# Patient Record
Sex: Female | Born: 1935 | Race: White | Hispanic: No | Marital: Single | Smoking: Current every day smoker
Health system: Southern US, Community
[De-identification: ages and names within clinical notes are randomized; demographics above are authoritative.]

## PROBLEM LIST (undated history)

## (undated) DIAGNOSIS — I10 Essential (primary) hypertension: Secondary | ICD-10-CM

## (undated) DIAGNOSIS — E7800 Pure hypercholesterolemia, unspecified: Secondary | ICD-10-CM

## (undated) DIAGNOSIS — J449 Chronic obstructive pulmonary disease, unspecified: Secondary | ICD-10-CM

## (undated) DIAGNOSIS — I4891 Unspecified atrial fibrillation: Secondary | ICD-10-CM

## (undated) DIAGNOSIS — I639 Cerebral infarction, unspecified: Secondary | ICD-10-CM

## (undated) DIAGNOSIS — I6522 Occlusion and stenosis of left carotid artery: Secondary | ICD-10-CM

## (undated) DIAGNOSIS — F329 Major depressive disorder, single episode, unspecified: Secondary | ICD-10-CM

## (undated) DIAGNOSIS — F419 Anxiety disorder, unspecified: Secondary | ICD-10-CM

## (undated) DIAGNOSIS — F32A Depression, unspecified: Secondary | ICD-10-CM

## (undated) DIAGNOSIS — R4701 Aphasia: Secondary | ICD-10-CM

---

## 2016-03-16 ENCOUNTER — Encounter (HOSPITAL_COMMUNITY): Payer: Self-pay | Admitting: Cardiology

## 2016-03-16 ENCOUNTER — Inpatient Hospital Stay (HOSPITAL_COMMUNITY)
Admission: EM | Admit: 2016-03-16 | Discharge: 2016-03-17 | DRG: 066 | Disposition: A | Discharge status: Home / Self Care | Payer: Medicare PPO | Attending: Internal Medicine | Admitting: Internal Medicine

## 2016-03-16 ENCOUNTER — Emergency Department (HOSPITAL_COMMUNITY): Payer: Medicare PPO

## 2016-03-16 ENCOUNTER — Inpatient Hospital Stay (HOSPITAL_COMMUNITY): Payer: Medicare PPO

## 2016-03-16 DIAGNOSIS — I63412 Cerebral infarction due to embolism of left middle cerebral artery: Secondary | ICD-10-CM | POA: Diagnosis present

## 2016-03-16 DIAGNOSIS — I69392 Facial weakness following cerebral infarction: Secondary | ICD-10-CM

## 2016-03-16 DIAGNOSIS — I10 Essential (primary) hypertension: Secondary | ICD-10-CM | POA: Diagnosis present

## 2016-03-16 DIAGNOSIS — E785 Hyperlipidemia, unspecified: Secondary | ICD-10-CM | POA: Diagnosis present

## 2016-03-16 DIAGNOSIS — I482 Chronic atrial fibrillation, unspecified: Secondary | ICD-10-CM | POA: Diagnosis present

## 2016-03-16 DIAGNOSIS — I6522 Occlusion and stenosis of left carotid artery: Secondary | ICD-10-CM | POA: Diagnosis present

## 2016-03-16 DIAGNOSIS — I639 Cerebral infarction, unspecified: Secondary | ICD-10-CM | POA: Diagnosis not present

## 2016-03-16 DIAGNOSIS — F17210 Nicotine dependence, cigarettes, uncomplicated: Secondary | ICD-10-CM | POA: Diagnosis present

## 2016-03-16 DIAGNOSIS — R8271 Bacteriuria: Secondary | ICD-10-CM | POA: Diagnosis present

## 2016-03-16 DIAGNOSIS — Z7289 Other problems related to lifestyle: Secondary | ICD-10-CM | POA: Diagnosis present

## 2016-03-16 DIAGNOSIS — J449 Chronic obstructive pulmonary disease, unspecified: Secondary | ICD-10-CM | POA: Diagnosis present

## 2016-03-16 DIAGNOSIS — Z7901 Long term (current) use of anticoagulants: Secondary | ICD-10-CM | POA: Diagnosis not present

## 2016-03-16 DIAGNOSIS — Z789 Other specified health status: Secondary | ICD-10-CM

## 2016-03-16 DIAGNOSIS — Z79890 Hormone replacement therapy (postmenopausal): Secondary | ICD-10-CM | POA: Diagnosis not present

## 2016-03-16 DIAGNOSIS — E7800 Pure hypercholesterolemia, unspecified: Secondary | ICD-10-CM | POA: Diagnosis present

## 2016-03-16 DIAGNOSIS — R4701 Aphasia: Secondary | ICD-10-CM

## 2016-03-16 DIAGNOSIS — F418 Other specified anxiety disorders: Secondary | ICD-10-CM | POA: Diagnosis present

## 2016-03-16 DIAGNOSIS — R4182 Altered mental status, unspecified: Secondary | ICD-10-CM | POA: Diagnosis not present

## 2016-03-16 DIAGNOSIS — I6789 Other cerebrovascular disease: Secondary | ICD-10-CM | POA: Diagnosis not present

## 2016-03-16 DIAGNOSIS — Z720 Tobacco use: Secondary | ICD-10-CM | POA: Diagnosis present

## 2016-03-16 HISTORY — DX: Aphasia: R47.01

## 2016-03-16 HISTORY — DX: Essential (primary) hypertension: I10

## 2016-03-16 HISTORY — DX: Pure hypercholesterolemia, unspecified: E78.00

## 2016-03-16 HISTORY — DX: Anxiety disorder, unspecified: F41.9

## 2016-03-16 HISTORY — DX: Chronic obstructive pulmonary disease, unspecified: J44.9

## 2016-03-16 HISTORY — DX: Occlusion and stenosis of left carotid artery: I65.22

## 2016-03-16 HISTORY — DX: Cerebral infarction, unspecified: I63.9

## 2016-03-16 HISTORY — DX: Major depressive disorder, single episode, unspecified: F32.9

## 2016-03-16 HISTORY — DX: Depression, unspecified: F32.A

## 2016-03-16 HISTORY — DX: Unspecified atrial fibrillation: I48.91

## 2016-03-16 LAB — CBC
HEMATOCRIT: 39.5 % (ref 36.0–46.0)
Hemoglobin: 13.9 g/dL (ref 12.0–15.0)
MCH: 32.6 pg (ref 26.0–34.0)
MCHC: 35.2 g/dL (ref 30.0–36.0)
MCV: 92.5 fL (ref 78.0–100.0)
PLATELETS: 163 10*3/uL (ref 150–400)
RBC: 4.27 MIL/uL (ref 3.87–5.11)
RDW: 13.2 % (ref 11.5–15.5)
WBC: 9.8 10*3/uL (ref 4.0–10.5)

## 2016-03-16 LAB — I-STAT CHEM 8, ED
BUN: 8 mg/dL (ref 6–20)
CHLORIDE: 98 mmol/L — AB (ref 101–111)
Calcium, Ion: 1.19 mmol/L (ref 1.12–1.23)
Creatinine, Ser: 0.6 mg/dL (ref 0.44–1.00)
GLUCOSE: 124 mg/dL — AB (ref 65–99)
HEMATOCRIT: 42 % (ref 36.0–46.0)
HEMOGLOBIN: 14.3 g/dL (ref 12.0–15.0)
POTASSIUM: 4.2 mmol/L (ref 3.5–5.1)
SODIUM: 135 mmol/L (ref 135–145)
TCO2: 26 mmol/L (ref 0–100)

## 2016-03-16 LAB — ETHANOL

## 2016-03-16 LAB — COMPREHENSIVE METABOLIC PANEL
ALT: 19 U/L (ref 14–54)
AST: 24 U/L (ref 15–41)
Albumin: 4.5 g/dL (ref 3.5–5.0)
Alkaline Phosphatase: 47 U/L (ref 38–126)
Anion gap: 9 (ref 5–15)
BUN: 10 mg/dL (ref 6–20)
CHLORIDE: 99 mmol/L — AB (ref 101–111)
CO2: 25 mmol/L (ref 22–32)
Calcium: 9.4 mg/dL (ref 8.9–10.3)
Creatinine, Ser: 0.66 mg/dL (ref 0.44–1.00)
Glucose, Bld: 129 mg/dL — ABNORMAL HIGH (ref 65–99)
POTASSIUM: 4.1 mmol/L (ref 3.5–5.1)
SODIUM: 133 mmol/L — AB (ref 135–145)
Total Bilirubin: 0.6 mg/dL (ref 0.3–1.2)
Total Protein: 7.9 g/dL (ref 6.5–8.1)

## 2016-03-16 LAB — RAPID URINE DRUG SCREEN, HOSP PERFORMED
AMPHETAMINES: NOT DETECTED
BENZODIAZEPINES: NOT DETECTED
Barbiturates: NOT DETECTED
COCAINE: NOT DETECTED
Opiates: NOT DETECTED
Tetrahydrocannabinol: NOT DETECTED

## 2016-03-16 LAB — I-STAT TROPONIN, ED: TROPONIN I, POC: 0 ng/mL (ref 0.00–0.08)

## 2016-03-16 LAB — DIFFERENTIAL
BASOS ABS: 0 10*3/uL (ref 0.0–0.1)
BASOS PCT: 0 %
EOS ABS: 0 10*3/uL (ref 0.0–0.7)
Eosinophils Relative: 0 %
Lymphocytes Relative: 11 %
Lymphs Abs: 1.1 10*3/uL (ref 0.7–4.0)
MONOS PCT: 5 %
Monocytes Absolute: 0.5 10*3/uL (ref 0.1–1.0)
NEUTROS ABS: 8.2 10*3/uL — AB (ref 1.7–7.7)
Neutrophils Relative %: 84 %

## 2016-03-16 LAB — URINE MICROSCOPIC-ADD ON

## 2016-03-16 LAB — URINALYSIS, ROUTINE W REFLEX MICROSCOPIC
Bilirubin Urine: NEGATIVE
Glucose, UA: NEGATIVE mg/dL
Ketones, ur: 15 mg/dL — AB
Nitrite: POSITIVE — AB
PROTEIN: NEGATIVE mg/dL
Specific Gravity, Urine: 1.02 (ref 1.005–1.030)
pH: 6 (ref 5.0–8.0)

## 2016-03-16 LAB — PROTIME-INR
INR: 2.23 — AB (ref 0.00–1.49)
PROTHROMBIN TIME: 24.5 s — AB (ref 11.6–15.2)

## 2016-03-16 LAB — TSH: TSH: 0.865 u[IU]/mL (ref 0.350–4.500)

## 2016-03-16 LAB — HEPARIN LEVEL (UNFRACTIONATED): HEPARIN UNFRACTIONATED: 1.97 [IU]/mL — AB (ref 0.30–0.70)

## 2016-03-16 LAB — APTT: APTT: 52 s — AB (ref 24–37)

## 2016-03-16 LAB — CBG MONITORING, ED: GLUCOSE-CAPILLARY: 131 mg/dL — AB (ref 65–99)

## 2016-03-16 MED ORDER — STROKE: EARLY STAGES OF RECOVERY BOOK
Freq: Once | Status: AC
  Administered 2016-03-17: 10:00:00
  Filled 2016-03-16: qty 1

## 2016-03-16 MED ORDER — NICOTINE 14 MG/24HR TD PT24
14.0000 mg | MEDICATED_PATCH | Freq: Every day | TRANSDERMAL | Status: DC
  Administered 2016-03-16 – 2016-03-17 (×2): 14 mg via TRANSDERMAL
  Filled 2016-03-16 (×2): qty 1

## 2016-03-16 MED ORDER — ONDANSETRON HCL 4 MG/2ML IJ SOLN: 4.0000 mg | Freq: Once | INTRAMUSCULAR | Status: AC

## 2016-03-16 MED ORDER — RIVAROXABAN 20 MG PO TABS: 20.0000 mg | ORAL_TABLET | Freq: Every day | ORAL | Status: DC

## 2016-03-16 MED ORDER — ONDANSETRON 4 MG PO TBDP: 8.0000 mg | ORAL_TABLET | Freq: Once | ORAL | Status: DC

## 2016-03-16 MED ORDER — HEPARIN (PORCINE) IN NACL 100-0.45 UNIT/ML-% IJ SOLN: 700.0000 [IU]/h | INTRAMUSCULAR | Status: DC

## 2016-03-16 MED ORDER — LEVALBUTEROL HCL 0.63 MG/3ML IN NEBU: 0.6300 mg | INHALATION_SOLUTION | Freq: Four times a day (QID) | RESPIRATORY_TRACT | Status: DC | PRN

## 2016-03-16 MED ORDER — ACETAMINOPHEN 650 MG RE SUPP: 650.0000 mg | RECTAL | Status: DC | PRN

## 2016-03-16 MED ORDER — RIVAROXABAN 15 MG PO TABS
15.0000 mg | ORAL_TABLET | Freq: Every day | ORAL | Status: DC
  Administered 2016-03-16 – 2016-03-17 (×2): 15 mg via ORAL
  Filled 2016-03-16 (×2): qty 1

## 2016-03-16 MED ORDER — HYDRALAZINE HCL 20 MG/ML IJ SOLN: 5.0000 mg | Freq: Four times a day (QID) | INTRAMUSCULAR | Status: DC | PRN

## 2016-03-16 MED ORDER — DEXTROSE-NACL 5-0.9 % IV SOLN
INTRAVENOUS | Status: DC
  Administered 2016-03-16 – 2016-03-17 (×2): via INTRAVENOUS

## 2016-03-16 MED ORDER — SODIUM CHLORIDE 0.9 % IV SOLN: INTRAVENOUS | Status: DC

## 2016-03-16 MED ORDER — ONDANSETRON HCL 4 MG/2ML IJ SOLN
INTRAMUSCULAR | Status: AC
  Filled 2016-03-16: qty 2

## 2016-03-16 MED ORDER — LORAZEPAM 2 MG/ML IJ SOLN: 0.2500 mg | Freq: Four times a day (QID) | INTRAMUSCULAR | Status: DC | PRN

## 2016-03-16 NOTE — ED Notes (Signed)
Pt back from MRI 

## 2016-03-16 NOTE — ED Notes (Signed)
Patients friend states he arrived at patients house this morning around 0730. States he found her in her pajamas and she would not speak. States he dressed her and brought her to the ED

## 2016-03-16 NOTE — Progress Notes (Signed)
2110 During neuro assessment patient was able to state her full name and birth date, reported that she was in the hospital because she had a stroke, no response when asked the year but correctly answered when asked who the president of the [place] is. Her responses are delayed but improvement noted. Significant other at the bedside during assessment and reported he noticed an improvement with her speaking and reported that she wasn't responding well to questions earlier today.

## 2016-03-16 NOTE — ED Notes (Signed)
Pt having dry heaves. Still speechless

## 2016-03-16 NOTE — ED Notes (Signed)
In to medicate patient for nausea. Family states they think patient might have been choking. Medication held. Pt experiencing no nausea or choking symptoms

## 2016-03-16 NOTE — ED Notes (Signed)
[facility]

## 2016-03-16 NOTE — Consult Note (Addendum)
[place] A. Merlene Laughter, MD     [email or web address]          Suzanne Pope is an 82 y.o. female.   ASSESSMENT/PLAN: 1. 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 The MRI features are limited to the cortical ribbon and does not involve one particular vascular territory. This raises concerns for the possibility of seizures especially given the patient presentation with confusion. 2. Chronic atrial fibrillation. 3. Hypertension. 4. Dyslipidemia.   RECOMMENDATION: Agree with current workup including echocardiogram and carotid Doppler. FU additional labs pending. EEG. Continue with Xarelto. I have discontinued the heparin as she is swallowing well. She passed her swallow evaluation. It is unclear if switching to another anticoagulation agent is of any particular value. Certainly added aspirin is not recommended given the increased risk of bleeding.    The patient is a 82 year old white female who lives by herself independently and is highly functional. She was found by her friend 7:30 this morning to be confused and having difficulty speaking. The patient is markedly a phasic on evaluation and cannot provide a history. She cannot state anything about why she is in the hospital or wound was a last time she was normal. The family and friend reported that the patient is highly functional. She is cognitively intact at baseline. She continues to have significant difficulties after presented to the hospital with the family. The review systems is severely limited given the cognitive impairment.    GENERAL: Pleasant thin female in no acute distress.  HEENT: Normal  ABDOMEN: soft  EXTREMITIES: No edema   BACK: Normal  SKIN: Normal by inspection.    MENTAL STATUS: She is awake and alert but is globally aphasic. She has a posterior speech. She does follow commands with much prompting. She knows that she is in the hospital. She States Her Age or the  Month.  CRANIAL NERVES: Pupils are equal, round and reactive to light and accomodation; extra ocular movements are full, there is no significant nystagmus; visual fields are full; upper and lower facial muscles are normal in strength and symmetric, there is no flattening of the nasolabial folds; tongue is midline; uvula is midline; shoulder elevation is normal.  MOTOR: She has deltoid weakness bilaterally. There is a mild drift involving the right upper extremity and right leg. Leg strength is 5/5. Bulk and tone are normal.   COORDINATION: Left finger to nose is normal, right finger to nose is normal, No rest tremor; no intention tremor; no postural tremor; no bradykinesia.  REFLEXES: Deep tendon reflexes are symmetrical and normal. Babinski reflexes are flexor bilaterally.   SENSATION: Normal to light touch and temperature. No clear visual or tactile extinction but limited given the cognitive impairment.   NIH stroke scale 2, 2, 2 total 6.    Blood pressure 168/58, pulse 59, temperature 98.7 F (37.1 C), temperature source Oral, resp. rate 18, height 5' 3"  (1.6 m), weight 135 lb (61.236 kg), SpO2 96 %.  Past Medical History  Diagnosis Date  . Atrial fibrillation ([place])   . COPD (chronic obstructive pulmonary disease) ([place])   . Hypertension   . Anxiety   . Depression   . Hypercholesteremia     History reviewed. No pertinent past surgical history.  History reviewed. No pertinent family history.  Social History:  reports that she has been smoking.  She does not have any smokeless tobacco history on file. She reports that she drinks alcohol. She reports that she does not use illicit drugs.  Allergies:  Allergies  Allergen Reactions  . Augmentin [Amoxicillin-Pot Clavulanate] Anaphylaxis  . Codeine Nausea Only  . Erythromycin     Medications: Prior to Admission medications   Medication Sig Start Date End Date Taking? Authorizing Provider  ALPRAZolam (XANAX) 0.25 MG tablet  Take 0.25 mg by mouth daily.  01/26/16  Yes Historical Provider, MD  estradiol (CLIMARA - DOSED IN MG/24 HR) 0.025 mg/24hr patch Place 0.025 mg onto the skin once a week.  01/25/16  Yes Historical Provider, MD  losartan (COZAAR) 50 MG tablet Take 50 mg by mouth daily.  01/12/16  Yes Historical Provider, MD  metoprolol tartrate (LOPRESSOR) 25 MG tablet Take 25 mg by mouth 2 (two) times daily.  12/21/15  Yes Historical Provider, MD  rivaroxaban (XARELTO) 20 MG TABS tablet Take 20 mg by mouth daily with supper.   Yes Historical Provider, MD  sertraline (ZOLOFT) 25 MG tablet Take 25 mg by mouth daily.   Yes Historical Provider, MD    Scheduled Meds: .  stroke: mapping our early stages of recovery book   Does not apply Once  . nicotine  14 mg Transdermal Daily   Continuous Infusions: . dextrose 5 % and 0.9% NaCl    . heparin     PRN Meds:.acetaminophen, hydrALAZINE, levalbuterol, LORazepam     Results for orders placed or performed during the hospital encounter of 03/16/16 (from the past 48 hour(s))  Ethanol     Status: None   Collection Time: 03/16/16  9:03 AM  Result Value Ref Range   Alcohol, Ethyl (B) <5 <5 mg/dL    Comment:        LOWEST DETECTABLE LIMIT FOR SERUM ALCOHOL IS 5 mg/dL FOR MEDICAL PURPOSES ONLY   Protime-INR     Status: Abnormal   Collection Time: 03/16/16  9:03 AM  Result Value Ref Range   Prothrombin Time 24.5 (H) 11.6 - 15.2 seconds   INR 2.23 (H) 0.00 - 1.49  [name]     Status: Abnormal   Collection Time: 03/16/16  9:03 AM  Result Value Ref Range   [name] 52 (H) 24 - 37 seconds    Comment:        IF BASELINE [name] IS ELEVATED, SUGGEST PATIENT RISK ASSESSMENT BE USED TO DETERMINE APPROPRIATE ANTICOAGULANT THERAPY.   CBC     Status: None   Collection Time: 03/16/16  9:03 AM  Result Value Ref Range   WBC 9.8 4.0 - 10.5 K/uL   RBC 4.27 3.87 - 5.11 MIL/uL   Hemoglobin 13.9 12.0 - 15.0 g/dL   HCT 39.5 36.0 - 46.0 %   MCV 92.5 78.0 - 100.0 fL   MCH 32.6 26.0 -  34.0 pg   MCHC 35.2 30.0 - 36.0 g/dL   RDW 13.2 11.5 - 15.5 %   Platelets 163 150 - 400 K/uL  Differential     Status: Abnormal   Collection Time: 03/16/16  9:03 AM  Result Value Ref Range   Neutrophils Relative % 84 %   Neutro Abs 8.2 (H) 1.7 - 7.7 K/uL   Lymphocytes Relative 11 %   Lymphs Abs 1.1 0.7 - 4.0 K/uL   Monocytes Relative 5 %   Monocytes Absolute 0.5 0.1 - 1.0 K/uL   Eosinophils Relative 0 %   Eosinophils Absolute 0.0 0.0 - 0.7 K/uL   Basophils Relative 0 %   Basophils Absolute 0.0 0.0 - 0.1 K/uL  Comprehensive metabolic panel     Status: Abnormal   Collection Time:  03/16/16  9:03 AM  Result Value Ref Range   Sodium 133 (L) 135 - 145 mmol/L   Potassium 4.1 3.5 - 5.1 mmol/L   Chloride 99 (L) 101 - 111 mmol/L   CO2 25 22 - 32 mmol/L   Glucose, Bld 129 (H) 65 - 99 mg/dL   BUN 10 6 - 20 mg/dL   Creatinine, Ser 0.66 0.44 - 1.00 mg/dL   Calcium 9.4 8.9 - 10.3 mg/dL   Total Protein 7.9 6.5 - 8.1 g/dL   Albumin 4.5 3.5 - 5.0 g/dL   AST 24 15 - 41 U/L   ALT 19 14 - 54 U/L   Alkaline Phosphatase 47 38 - 126 U/L   Total Bilirubin 0.6 0.3 - 1.2 mg/dL   GFR calc non Af Amer >60 >60 mL/min   GFR calc Af Amer >60 >60 mL/min    Comment: (NOTE) The eGFR has been calculated using the CKD EPI equation. This calculation has not been validated in all clinical situations. eGFR's persistently <60 mL/min signify possible Chronic Kidney Disease.    Anion gap 9 5 - 15  CBG monitoring, ED     Status: Abnormal   Collection Time: 03/16/16  9:05 AM  Result Value Ref Range   Glucose-Capillary 131 (H) 65 - 99 mg/dL  I-stat troponin, ED (not at [facility], [facility])     Status: None   Collection Time: 03/16/16  9:16 AM  Result Value Ref Range   Troponin i, poc 0.00 0.00 - 0.08 ng/mL   Comment 3            Comment: Due to the release kinetics of cTnI, a negative result within the first hours of the onset of symptoms does not rule out myocardial infarction with certainty. If myocardial  infarction is still suspected, repeat the test at appropriate intervals.   I-Stat Chem 8, ED  (not at [facility], [facility])     Status: Abnormal   Collection Time: 03/16/16  9:18 AM  Result Value Ref Range   Sodium 135 135 - 145 mmol/L   Potassium 4.2 3.5 - 5.1 mmol/L   Chloride 98 (L) 101 - 111 mmol/L   BUN 8 6 - 20 mg/dL   Creatinine, Ser 0.60 0.44 - 1.00 mg/dL   Glucose, Bld 124 (H) 65 - 99 mg/dL   Calcium, Ion 1.19 1.12 - 1.23 mmol/L   TCO2 26 0 - 100 mmol/L   Hemoglobin 14.3 12.0 - 15.0 g/dL   HCT 42.0 36.0 - 46.0 %  Urine rapid drug screen (hosp performed)not at [facility]     Status: None   Collection Time: 03/16/16  1:24 PM  Result Value Ref Range   Opiates NONE DETECTED NONE DETECTED   Cocaine NONE DETECTED NONE DETECTED   Benzodiazepines NONE DETECTED NONE DETECTED   Amphetamines NONE DETECTED NONE DETECTED   Tetrahydrocannabinol NONE DETECTED NONE DETECTED   Barbiturates NONE DETECTED NONE DETECTED    Comment:        DRUG SCREEN FOR MEDICAL PURPOSES ONLY.  IF CONFIRMATION IS NEEDED FOR ANY PURPOSE, NOTIFY LAB WITHIN 5 DAYS.        LOWEST DETECTABLE LIMITS FOR URINE DRUG SCREEN Drug Class       Cutoff (ng/mL) Amphetamine      1000 Barbiturate      200 Benzodiazepine   518 Tricyclics       841 Opiates          300 Cocaine  300 THC              50   Urinalysis, Routine w reflex microscopic (not at [facility])     Status: Abnormal   Collection Time: 03/16/16  1:24 PM  Result Value Ref Range   Color, Urine YELLOW YELLOW   APPearance CLEAR CLEAR   Specific Gravity, Urine 1.020 1.005 - 1.030   pH 6.0 5.0 - 8.0   Glucose, UA NEGATIVE NEGATIVE mg/dL   Hgb urine dipstick MODERATE (A) NEGATIVE   Bilirubin Urine NEGATIVE NEGATIVE   Ketones, ur 15 (A) NEGATIVE mg/dL   Protein, ur NEGATIVE NEGATIVE mg/dL   Nitrite POSITIVE (A) NEGATIVE   Leukocytes, UA SMALL (A) NEGATIVE  Urine microscopic-add on     Status: Abnormal   Collection Time: 03/16/16  1:24 PM  Result Value Ref Range    Squamous Epithelial / LPF 6-30 (A) NONE SEEN   WBC, UA 6-30 0 - 5 WBC/hpf   RBC / HPF 6-30 0 - 5 RBC/hpf   Bacteria, UA MANY (A) NONE SEEN    Studies/Results:    BRAIN MRI FINDINGS: There are small acute infarcts in the left cerebral hemisphere involving both the ACA and MCA territories. The largest infarcts are cortically based in the medial left frontal lobe and cingulate gyrus and measure up to 2.5 cm in size. Additional subcentimeter infarcts are noted slightly more laterally in the posterior left frontal lobe as well as in the region of the left caudate body and posterior left temporal lobe.  There is no evidence of acute intracranial hemorrhage, mass, midline shift, or extra-axial fluid collection. There may be a chronic microhemorrhage in the left temporal lobe. Patchy to confluent T2 hyperintensities in the deep cerebral white matter bilaterally are nonspecific but compatible with moderate chronic small vessel ischemic disease. Mild chronic ischemic changes are noted in the pons and thalami. There are tiny chronic infarcts in the cerebellum bilaterally as well as basal ganglia. There is mild generalized cerebral atrophy.  Prior bilateral cataract extraction is noted. There are trace bilateral mastoid effusions. The paranasal sinuses are clear. Major intracranial vascular flow voids are preserved.  IMPRESSION: 1. Small acute left ACA and left MCA infarcts with greatest involvement of the frontal lobe and cingulate gyrus. 2. Moderate chronic small vessel ischemic disease.       The patient's brain MRI is reviewed in person. There is increased signal seen on diffusion imaging involving the left frontal regions respecting no particular vascular territory. There are a few of these limited to the cortical ribbon including the medial frontal region and the lateral cortical or area.  There is corresponding reduced signal seen on the ADC scan. There is no  microhemorrhages. There is moderate global atrophy. There is moderate deep white matter and periventricular leukoencephalopathy.      Orissa Arreaga A. Merlene Laughter, M.D.  Diplomate, Tax adviser of Psychiatry and Neurology ( Neurology). 03/16/2016, 5:54 PM

## 2016-03-16 NOTE — Progress Notes (Signed)
ANTICOAGULATION CONSULT NOTE - Initial Consult  Pharmacy Consult for Heparin Indication: atrial fibrillation / embolic stroke  Allergies  Allergen Reactions  . Augmentin [Amoxicillin-Pot Clavulanate] Anaphylaxis  . Codeine Nausea Only  . Erythromycin     Patient Measurements: Height: 5\' 3"  (160 cm) Weight: 135 lb (61.236 kg) IBW/kg (Calculated) : 52.4 Heparin Dosing Weight: 61.2 kg  Vital Signs: Temp: 98.5 F (36.9 C) (07/20 0910) Temp Source: Oral (07/20 0910) BP: 176/63 mmHg (07/20 1530) Pulse Rate: 56 (07/20 1530)  Labs:  Recent Labs  03/16/16 0903 03/16/16 0918  HGB 13.9 14.3  HCT 39.5 42.0  PLT 163  --   [name] 52*  --   LABPROT 24.5*  --   INR 2.23*  --   CREATININE 0.66 0.60    Estimated Creatinine Clearance: 47.2 mL/min (by C-G formula based on Cr of 0.6).   Medical History: Past Medical History  Diagnosis Date  . Atrial fibrillation ([facility])   . COPD (chronic obstructive pulmonary disease) ([facility])   . Hypertension   . Anxiety   . Depression   . Hypercholesteremia     Medications:  Prescriptions prior to admission  Medication Sig Dispense Refill Last Dose  . ALPRAZolam (XANAX) 0.25 MG tablet Take 0.25 mg by mouth daily.    unknown  . estradiol (CLIMARA - DOSED IN MG/24 HR) 0.025 mg/24hr patch Place 0.025 mg onto the skin once a week.    unknown  . losartan (COZAAR) 50 MG tablet Take 50 mg by mouth daily.    unknown  . metoprolol tartrate (LOPRESSOR) 25 MG tablet Take 25 mg by mouth 2 (two) times daily.    unknown  . rivaroxaban (XARELTO) 20 MG TABS tablet Take 20 mg by mouth daily with supper.   unknown  . sertraline (ZOLOFT) 25 MG tablet Take 25 mg by mouth daily.   unknown    Assessment: 82 yo female admitted from ED with acute CVA. Patient taking Xarelto PTA for atrial fibrillation, last dose apparently yesterday at supper.  Pharmacy protocol for heparin for AFIB. Baseline [name] elevated most likely due to patient on Xarelto. Baseline heparin  level ordered. Pharmacy will use [name] level to adjust heparin if baseline heparin level elevated due to patient being on Xarelto    Goal of Therapy:  Heparin level 0.3-0.5 units/ml [name] 66-85 seconds Monitor platelets by anticoagulation protocol: Yes   Plan:  Start heparin infusion at 700 units/hr (WITH NO BOLUS) after baseline heparin level drawn, do not have to wait for results Check heparin level in 8 hours and daily while on heparin [name] level in 8 hours and daily until heparin level correlates with [name] level Continue to monitor H&H and platelets  Raquel JamesPittman, Burle Kwan Bennett 03/16/2016,5:03 PM

## 2016-03-16 NOTE — ED Notes (Signed)
Pt is talking now. She identifies the female in her room as her friend but she identifies the female as her nephew

## 2016-03-16 NOTE — ED Notes (Signed)
cbg of 131.

## 2016-03-16 NOTE — ED Provider Notes (Signed)
CSN: [id number]     Arrival date & time 03/16/16  4098 History  By signing my name below, I, Suzanne Pope, attest that this documentation has been prepared under the direction and in the presence of Suzanne Bale, MD. Electronically Signed: Tanda Pope, ED Scribe. 03/16/2016. 9:26 AM.   Chief Complaint  Patient presents with  . Stroke Symptoms   LEVEL 5 CAVEAT for altered mental status  The history is provided by medical records and the EMS personnel. No language interpreter was used.   HPI Comments: Suzanne Pope is a 82 y.o. female who presents to the Emergency Department for possible stroke. Per nurse, pt's friend came to check on her at home at 7:30 AM and noticed pt was unable to speak. Friend took pt to an urgent care at [facility] but was told to come to the ED instead. Nurse states that pt was able to ambulate out of the wheelchair into the ED but was very slow with walking. Last normal sometime last night.   Spoke with family and friend, friend states at 7:30 pt was standing in middle of floor standing and staring straight off into space. He has never seen pt act like this in the past. Pt is on anticoagulants. Friend saw pt last night and states she was acting at baseline. Niece reports that pt has been looking at pt and understands them but cannot speak.   Past Medical History  Diagnosis Date  . Atrial fibrillation (HCC)   . COPD (chronic obstructive pulmonary disease) (HCC)   . Hypertension   . Anxiety   . Depression   . Hypercholesteremia    History reviewed. No pertinent past surgical history. History reviewed. No pertinent family history. Social History  Substance Use Topics  . Smoking status: Current Every Day Smoker  . Smokeless tobacco: None  . Alcohol Use: Yes     Comment: occ   OB History    No data available     Review of Systems  Unable to perform ROS: Patient nonverbal   Allergies  Augmentin; Codeine; and Erythromycin  Home Medications   Prior to  Admission medications   Medication Sig Start Date End Date Taking? Authorizing Provider  ALPRAZolam (XANAX) 0.25 MG tablet Take 0.25 mg by mouth daily.  01/26/16  Yes Historical Provider, MD  estradiol (CLIMARA - DOSED IN MG/24 HR) 0.025 mg/24hr patch Place 0.025 mg onto the skin once a week.  01/25/16  Yes Historical Provider, MD  losartan (COZAAR) 50 MG tablet Take 50 mg by mouth daily.  01/12/16  Yes Historical Provider, MD  metoprolol tartrate (LOPRESSOR) 25 MG tablet Take 25 mg by mouth 2 (two) times daily.  12/21/15  Yes Historical Provider, MD  rivaroxaban (XARELTO) 20 MG TABS tablet Take 20 mg by mouth daily with supper.   Yes Historical Provider, MD  sertraline (ZOLOFT) 25 MG tablet Take 25 mg by mouth daily.   Yes Historical Provider, MD   BP 173/57 mmHg  Pulse 56  Temp(Src) 98.5 F (36.9 C) (Oral)  Resp 18  Ht 5\' 3"  (1.6 m)  Wt 135 lb (61.236 kg)  BMI 23.92 kg/m2  SpO2 97%   Physical Exam  Constitutional: She appears well-developed and well-nourished.  HENT:  Head: Normocephalic and atraumatic.  Eyes: Conjunctivae and EOM are normal. Pupils are equal, round, and reactive to light.  Neck: Normal range of motion and phonation normal. Neck supple.  Cardiovascular: Normal rate and regular rhythm.   Pulmonary/Chest: Effort normal and breath sounds  normal. She exhibits no tenderness.  Abdominal: Soft. She exhibits no distension. There is no tenderness. There is no guarding.  Musculoskeletal: Normal range of motion.  Neurological: She is alert. She exhibits normal muscle tone.  No receptive aphasia Follows commands accurately Nonverbal   Skin: Skin is warm and dry.  Nursing note and vitals reviewed.   ED Course  Procedures (including critical care time)  DIAGNOSTIC STUDIES: Oxygen Saturation is 97% on RA, normal by my interpretation.    Medications  ondansetron (ZOFRAN) injection 4 mg (not administered)  ondansetron (ZOFRAN-ODT) disintegrating tablet 8 mg (8 mg Oral Not  Given 03/16/16 1128)    Patient Vitals for the past 24 hrs:  BP Temp Temp src Pulse Resp SpO2 Height Weight  03/16/16 1430 173/57 mmHg - - (!) 56 18 97 % - -  03/16/16 1400 168/59 mmHg - - (!) 58 21 96 % - -  03/16/16 1330 144/83 mmHg - - (!) 58 21 96 % - -  03/16/16 1324 174/56 mmHg - - 60 20 95 % - -  03/16/16 1300 174/56 mmHg - - 61 20 95 % - -  03/16/16 1230 138/57 mmHg - - (!) 53 18 96 % - -  03/16/16 1200 165/98 mmHg - - (!) 57 21 96 % - -  03/16/16 1145 - - - (!) 57 21 98 % - -  03/16/16 1143 - - - (!) 57 19 98 % - -  03/16/16 1139 160/89 mmHg - - - - - - -  03/16/16 1100 - - - (!) 57 22 96 % - -  03/16/16 1045 - - - (!) 58 20 97 % - -  03/16/16 0910 182/65 mmHg 98.5 F (36.9 C) Oral (!) 59 16 97 % 5\' 3"  (1.6 m) 135 lb (61.236 kg)    COORDINATION OF CARE: 9:12 AM-[name], Chem 8, EtOH, [name], [name], CBC, Differential, CMP, Troponin, Rapid drug screen, UA, and CBG   10:11 AM- Spoke with family and friend. Friend states at 7:30 AM pt was standing in middle of floor standing and staring straight off into space. He has never seen pt act like this in the past. Friend saw pt last night and states she was acting at baseline. Niece reports that pt has been looking at pt in ED and understands them but cannot speak. Pt did smile to family member while in ED. Pt is on anticoagulants but friend/neice are unaware of the type of blood thinner.   11:00 AM Reevaluation with update and discussion. After initial assessment and treatment, an updated evaluation reveals Patient is sitting up, appearing more alert, and has very trace right facial weakness. When asked for response, she verbalized an unintelligible sound. Patient and family updated on findings. MRI ordered to evaluate for CT occult stroke. Suzanne Pope L   2:42 PM- Case discussed with tele-neurology, who advises hospitalization, further evaluation with carotid Dopplers, cardiac echo, speech and physical  therapy. They will be sending additional information, by fax.  2:43 PM-Consult complete with hospitalist. Patient case explained and discussed. She agrees to admit patient for further evaluation and treatment. Call ended at 1450  CRITICAL CARE Performed by: Flint MelterWENTZ,Ulus Hazen L Total critical care time: 35 minutes Critical care time was exclusive of separately billable procedures and treating other patients. Critical care was necessary to treat or prevent imminent or life-threatening deterioration. Critical care was time spent personally by me on the following activities: development of treatment plan with patient and/or  surrogate as well as nursing, discussions with consultants, evaluation of patient's response to treatment, examination of patient, obtaining history from patient or surrogate, ordering and performing treatments and interventions, ordering and review of laboratory studies, ordering and review of radiographic studies, pulse oximetry and re-evaluation of patient's condition.   Labs Review Labs Reviewed  PROTIME-INR - Abnormal; Notable for the following:    Prothrombin Time 24.5 (*)    INR 2.23 (*)    All other components within normal limits  [name] - Abnormal; Notable for the following:    [name] 52 (*)    All other components within normal limits  DIFFERENTIAL - Abnormal; Notable for the following:    Neutro Abs 8.2 (*)    All other components within normal limits  COMPREHENSIVE METABOLIC PANEL - Abnormal; Notable for the following:    Sodium 133 (*)    Chloride 99 (*)    Glucose, Bld 129 (*)    All other components within normal limits  URINALYSIS, ROUTINE W REFLEX MICROSCOPIC (NOT AT [facility]) - Abnormal; Notable for the following:    Hgb urine dipstick MODERATE (*)    Ketones, ur 15 (*)    Nitrite POSITIVE (*)    Leukocytes, UA SMALL (*)    All other components within normal limits  URINE MICROSCOPIC-ADD ON - Abnormal; Notable for the following:    Squamous Epithelial / LPF  6-30 (*)    Bacteria, UA MANY (*)    All other components within normal limits  CBG MONITORING, ED - Abnormal; Notable for the following:    Glucose-Capillary 131 (*)    All other components within normal limits  I-STAT CHEM 8, ED - Abnormal; Notable for the following:    Chloride 98 (*)    Glucose, Bld 124 (*)    All other components within normal limits  ETHANOL  CBC  URINE RAPID DRUG SCREEN, HOSP PERFORMED  I-STAT TROPOININ, ED    Imaging Review Ct Head Wo Contrast  03/16/2016  CLINICAL DATA:  Altered mental status.  Stroke symptoms. EXAM: CT HEAD WITHOUT CONTRAST TECHNIQUE: Contiguous axial images were obtained from the base of the skull through the vertex without intravenous contrast. COMPARISON:  None. FINDINGS: Brain: Scattered minimal periventricular hypodensities compatible microvascular ischemic disease. The gray-white differentiation is otherwise well maintained without CT evidence of acute large territory infarct. No intraparenchymal or extra-axial mass or hemorrhage. Normal size and configuration of the ventricles and basilar cisterns. No midline shift. Vascular: No hyperdense vessel or unexpected calcification. Skull: No displaced calvarial fracture. Sinuses/Orbits: Limited visualization the paranasal sinuses and mastoid air cells is normal. No air-fluid levels. Post bilateral cataract surgery. Other: Regional soft tissues appear normal. IMPRESSION: Microvascular ischemic disease without acute intracranial process. Electronically Signed   By: Simonne Come M.D.   On: 03/16/2016 10:25   Mr Brain Wo Contrast  03/16/2016  CLINICAL DATA:  Aphasia.  Altered mental status. EXAM: MRI HEAD WITHOUT CONTRAST TECHNIQUE: Multiplanar, multiecho pulse sequences of the brain and surrounding structures were obtained without intravenous contrast. COMPARISON:  Head CT 03/16/2016 FINDINGS: There are small acute infarcts in the left cerebral hemisphere involving both the ACA and MCA territories. The  largest infarcts are cortically based in the medial left frontal lobe and cingulate gyrus and measure up to 2.5 cm in size. Additional subcentimeter infarcts are noted slightly more laterally in the posterior left frontal lobe as well as in the region of the left caudate body and posterior left temporal lobe. There is no evidence  of acute intracranial hemorrhage, mass, midline shift, or extra-axial fluid collection. There may be a chronic microhemorrhage in the left temporal lobe. Patchy to confluent T2 hyperintensities in the deep cerebral white matter bilaterally are nonspecific but compatible with moderate chronic small vessel ischemic disease. Mild chronic ischemic changes are noted in the pons and thalami. There are tiny chronic infarcts in the cerebellum bilaterally as well as basal ganglia. There is mild generalized cerebral atrophy. Prior bilateral cataract extraction is noted. There are trace bilateral mastoid effusions. The paranasal sinuses are clear. Major intracranial vascular flow voids are preserved. IMPRESSION: 1. Small acute left ACA and left MCA infarcts with greatest involvement of the frontal lobe and cingulate gyrus. 2. Moderate chronic small vessel ischemic disease. Electronically Signed   By: Sebastian Ache M.D.   On: 03/16/2016 11:54   I have personally reviewed and evaluated these images and lab results as part of my medical decision-making.   EKG Interpretation   Date/Time:  Thursday March 16 2016 09:08:27 EDT Ventricular Rate:  60 PR Interval:    QRS Duration: 100 QT Interval:  442 QTC Calculation: 442 R Axis:   79 Text Interpretation:  Sinus rhythm Borderline ST depression, inferior  leads No old tracing to compare Confirmed by [facility]  MD, Lekeisha Arenas [phone]) on  03/16/2016 9:13:00 AM      MDM   Final diagnoses:  Cerebral infarction due to unspecified mechanism    Acute CVA, not a candidate for thrombolysis. She will require hospitalization for further testing and  treatment.  Nursing Notes Reviewed/ Care Coordinated, and agree without changes. Applicable Imaging Reviewed.  Interpretation of Laboratory Data incorporated into ED treatment  Plan: Admit  I personally performed the services described in this documentation, which was scribed in my presence. The recorded information has been reviewed and is accurate.      Suzanne Bale, MD 03/16/16 1538

## 2016-03-16 NOTE — H&P (Addendum)
History and Physical    Suzanne Pope ZOX:[id number] DOB: [birth year] DOA: 03/16/2016  PCP: [facility], query Dr. Shela Commons  Patient coming from: Home  Chief Complaint: Altered mental status; patient unable to speak.  HPI: Suzanne Pope is a 82 y.o. female with medical history significant for chronic atrial fibrillation on Xarelto, hypertension, COPD, anxiety with depression, and tobacco abuse who presents to the ED after her female friend, Mr. Suzanne Pope, found her this morning at approximately 7:30 AM confused and unable to speak. (Patient has expressive aphasia, so her niece, Ms. Lorre Munroe is providing the history along with the ED staff). Apparently, Mr. Suzanne Pope found her this morning standing in the middle of the floor staring in space and unable to speak. He took her to the urgent care in [place], but was told to come to the ED instead. The patient was able to ambulate with assistance. There was no reported incontinence of bladder bowels, seizure activity, or any type of trauma. Reportedly, the patient was in her usual state of health last night when Mr. Suzanne Pope left her at 9:00 PM. It is unclear when her symptoms actually started.  ED Course: In the ED, she was moderately hypertensive and mildly bradycardic. She was afebrile.  MRI of her brain revealed small acute left ACA and left MCA infarcts with greatest involvement of the frontal lobe and moderate chronic small vessel ischemic disease. Her INR was 2.23. Urinalysis revealed many bacteria, nitrite, and small leukocytes. Urine drug screen was negative. She was admitted for further evaluation and management.  Review of Systems: Unable to obtain as the patient has expressive aphasia.  Past Medical History  Diagnosis Date  . Atrial fibrillation (HCC)   . COPD (chronic obstructive pulmonary disease) (HCC)   . Hypertension   . Anxiety   . Depression   . Hypercholesteremia     History reviewed. No pertinent past surgical history.  Social history: She is  widowed, but has a close female friend. She lives alone. She has no children. She smokes greater than one pack of cigarettes per day. She drinks alcohol daily, but her niece was unable to quantify. She has no history of illicit drug use.  Allergies  Allergen Reactions  . Augmentin [Amoxicillin-Pot Clavulanate] Anaphylaxis  . Codeine Nausea Only  . Erythromycin    Family history: (Provided by the patient's niece) patient's parents are deceased; etiology of her father's death unknown, but her mother died of a brain tumor.   Prior to Admission medications   Medication Sig Start Date End Date Taking? Authorizing Provider  ALPRAZolam (XANAX) 0.25 MG tablet Take 0.25 mg by mouth daily.  01/26/16  Yes Historical Provider, MD  estradiol (CLIMARA - DOSED IN MG/24 HR) 0.025 mg/24hr patch Place 0.025 mg onto the skin once a week.  01/25/16  Yes Historical Provider, MD  losartan (COZAAR) 50 MG tablet Take 50 mg by mouth daily.  01/12/16  Yes Historical Provider, MD  metoprolol tartrate (LOPRESSOR) 25 MG tablet Take 25 mg by mouth 2 (two) times daily.  12/21/15  Yes Historical Provider, MD  rivaroxaban (XARELTO) 20 MG TABS tablet Take 20 mg by mouth daily with supper.   Yes Historical Provider, MD  sertraline (ZOLOFT) 25 MG tablet Take 25 mg by mouth daily.   Yes Historical Provider, MD    Physical Exam: Filed Vitals:   03/16/16 1400 03/16/16 1430 03/16/16 1500 03/16/16 1530  BP: 168/59 173/57 181/70 176/63  Pulse: 58 56 59 56  Temp:  TempSrc:      Resp: 21 18 19 20   Height:      Weight:      SpO2: 96% 97% 96% 97%      Constitutional: NAD, calm, comfortable Filed Vitals:   03/16/16 1400 03/16/16 1430 03/16/16 1500 03/16/16 1530  BP: 168/59 173/57 181/70 176/63  Pulse: 58 56 59 56  Temp:      TempSrc:      Resp: 21 18 19 20   Height:      Weight:      SpO2: 96% 97% 96% 97%   Eyes: PERRL, conjunctivae normal; mild right-sided ptosis ENMT: Mucous membranes are mildly dry. Posterior  pharynx clear of any exudate or lesions.Normal dentition.  Neck: normal, supple, no masses, no thyromegaly; no obvious audible bruit Respiratory: clear to auscultation bilaterally, no wheezing, no crackles. Normal respiratory effort. No accessory muscle use.  Cardiovascular: Irregular, irregular. No extremity edema. 2+ pedal pulses. No carotid bruits.  Abdomen: no tenderness, no masses palpated. No hepatosplenomegaly. Bowel sounds positive.  Musculoskeletal: no clubbing / cyanosis. No joint deformity upper and lower extremities. Good ROM, no contractures. Normal muscle tone.  Skin: no rashes, lesions, ulcers. No induration Neurologic: Right facial droop. Patient had difficulty protruding her tongue, but was slowly able to move her tongue out into the left into the right; mild decrease elevation of palate on the right; advanced expressive aphasia, but the patient was intermittently able to correctly verbalize how many fingers the examiner was holding up. Sensation intact. Mild decreased strength of hand grip on the right at 4+ over 5 and 5 over 5 strength of the left upper extremity. Patient was able to raise both legs against gravity approximately 45. No significant pronator drift.  Psychiatric: She appears to have a pleasant affect and smiled a couple of times.    Labs on Admission: I have personally reviewed following labs and imaging studies  CBC:  Recent Labs Lab 03/16/16 0903 03/16/16 0918  WBC 9.8  --   NEUTROABS 8.2*  --   HGB 13.9 14.3  HCT 39.5 42.0  MCV 92.5  --   PLT 163  --    Basic Metabolic Panel:  Recent Labs Lab 03/16/16 0903 03/16/16 0918  NA 133* 135  K 4.1 4.2  CL 99* 98*  CO2 25  --   GLUCOSE 129* 124*  BUN 10 8  CREATININE 0.66 0.60  CALCIUM 9.4  --    GFR: Estimated Creatinine Clearance: 47.2 mL/min (by C-G formula based on Cr of 0.6). Liver Function Tests:  Recent Labs Lab 03/16/16 0903  AST 24  ALT 19  ALKPHOS 47  BILITOT 0.6  PROT 7.9    ALBUMIN 4.5   No results for input(s): LIPASE, AMYLASE in the last 168 hours. No results for input(s): AMMONIA in the last 168 hours. Coagulation Profile:  Recent Labs Lab 03/16/16 0903  INR 2.23*   Cardiac Enzymes: No results for input(s): CKTOTAL, CKMB, CKMBINDEX, TROPONINI in the last 168 hours. BNP (last 3 results) No results for input(s): PROBNP in the last 8760 hours. HbA1C: No results for input(s): HGBA1C in the last 72 hours. CBG:  Recent Labs Lab 03/16/16 0905  GLUCAP 131*   Lipid Profile: No results for input(s): CHOL, HDL, LDLCALC, TRIG, CHOLHDL, LDLDIRECT in the last 72 hours. Thyroid Function Tests: No results for input(s): TSH, T4TOTAL, FREET4, T3FREE, THYROIDAB in the last 72 hours. Anemia Panel: No results for input(s): VITAMINB12, FOLATE, FERRITIN, TIBC, IRON, RETICCTPCT in the last  72 hours. Urine analysis:    Component Value Date/Time   COLORURINE YELLOW 03/16/2016 1324   APPEARANCEUR CLEAR 03/16/2016 1324   LABSPEC 1.020 03/16/2016 1324   PHURINE 6.0 03/16/2016 1324   GLUCOSEU NEGATIVE 03/16/2016 1324   HGBUR MODERATE* 03/16/2016 1324   BILIRUBINUR NEGATIVE 03/16/2016 1324   KETONESUR 15* 03/16/2016 1324   PROTEINUR NEGATIVE 03/16/2016 1324   NITRITE POSITIVE* 03/16/2016 1324   LEUKOCYTESUR SMALL* 03/16/2016 1324   Sepsis Labs: !!!!!!!!!!!!!!!!!!!!!!!!!!!!!!!!!!!!!!!!!!!! @LABRCNTIP (procalcitonin:4,lacticidven:4) )No results found for this or any previous visit (from the past 240 hour(s)).   Radiological Exams on Admission: Ct Head Wo Contrast  03/16/2016  CLINICAL DATA:  Altered mental status.  Stroke symptoms. EXAM: CT HEAD WITHOUT CONTRAST TECHNIQUE: Contiguous axial images were obtained from the base of the skull through the vertex without intravenous contrast. COMPARISON:  None. FINDINGS: Brain: Scattered minimal periventricular hypodensities compatible microvascular ischemic disease. The gray-white differentiation is otherwise well  maintained without CT evidence of acute large territory infarct. No intraparenchymal or extra-axial mass or hemorrhage. Normal size and configuration of the ventricles and basilar cisterns. No midline shift. Vascular: No hyperdense vessel or unexpected calcification. Skull: No displaced calvarial fracture. Sinuses/Orbits: Limited visualization the paranasal sinuses and mastoid air cells is normal. No air-fluid levels. Post bilateral cataract surgery. Other: Regional soft tissues appear normal. IMPRESSION: Microvascular ischemic disease without acute intracranial process. Electronically Signed   By: Simonne Come M.D.   On: 03/16/2016 10:25   Mr Brain Wo Contrast  03/16/2016  CLINICAL DATA:  Aphasia.  Altered mental status. EXAM: MRI HEAD WITHOUT CONTRAST TECHNIQUE: Multiplanar, multiecho pulse sequences of the brain and surrounding structures were obtained without intravenous contrast. COMPARISON:  Head CT 03/16/2016 FINDINGS: There are small acute infarcts in the left cerebral hemisphere involving both the ACA and MCA territories. The largest infarcts are cortically based in the medial left frontal lobe and cingulate gyrus and measure up to 2.5 cm in size. Additional subcentimeter infarcts are noted slightly more laterally in the posterior left frontal lobe as well as in the region of the left caudate body and posterior left temporal lobe. There is no evidence of acute intracranial hemorrhage, mass, midline shift, or extra-axial fluid collection. There may be a chronic microhemorrhage in the left temporal lobe. Patchy to confluent T2 hyperintensities in the deep cerebral white matter bilaterally are nonspecific but compatible with moderate chronic small vessel ischemic disease. Mild chronic ischemic changes are noted in the pons and thalami. There are tiny chronic infarcts in the cerebellum bilaterally as well as basal ganglia. There is mild generalized cerebral atrophy. Prior bilateral cataract extraction is  noted. There are trace bilateral mastoid effusions. The paranasal sinuses are clear. Major intracranial vascular flow voids are preserved. IMPRESSION: 1. Small acute left ACA and left MCA infarcts with greatest involvement of the frontal lobe and cingulate gyrus. 2. Moderate chronic small vessel ischemic disease. Electronically Signed   By: Sebastian Ache M.D.   On: 03/16/2016 11:54    EKG: Independently reviewed. Sinus rhythm with nonspecific ST T abnormalities and a heart rate of 60 bpm.  Assessment/Plan Principal Problem:   Acute embolic stroke [facility]) Active Problems:   Expressive aphasia   Chronic atrial fibrillation (HCC)   Essential hypertension   Bacteriuria   Tobacco abuse   Alcohol use (HCC)   Depression with anxiety    This is a 82 year old woman with a chronic atrial fibrillation on Xarelto, hypertension, tobacco abuse, and alcohol use of unknown quantity, who presents with confusion  and expressive aphasia. MRI of the brain confirmed left-sided acute strokes. It is presumed that the strokes are embolic in therefore, she may have failed Xarelto therapy. Because of her expressive aphasia, it is unknown if she was compliant with Xarelto. She is moderately hypertensive and is treated chronically with losartan and metoprolol. We will allow permissive hypertension, but not necessarily be on 180-190 systolically. Due to her expressive aphasia, she will be made nothing by mouth until the speech therapist can evaluate her.   Plan: 1. Will hold all oral medications until she is evaluated with a bedside swallow evaluation protocol and/or the speech therapist. 2. Will start IV heparin per pharmacy with caution in the setting of an INR of 2.2. 3. Will hold metoprolol as she is borderline bradycardic. Will treat her hypertension with when necessary hydralazine for systolic blood pressure greater than 180. 4. Will order small dosing of as needed IV lorazepam given her chronic use of Xanax and  sertraline for anxiety and depression. 5. Will place a nicotine patch. Will order tobacco cessation counseling when appropriate. 6. Will hold on starting antibiotics for bacteriuria as the patient's white blood cell count is within normal limits and she is not febrile. 7. For further evaluation, will order 2-D echocardiogram, carotid ultrasound, and additional laboratory studies. 8. Will consult neurologist Dr. Gerilyn Pilgrimoonquah regarding anticoagulant therapy in the setting of possible failed therapy with Xarelto.    DVT prophylaxis: Heparin drip Code Status: Full code Family Communication: Discussed with niece, Ms. Lorre MunroeJonas Disposition Plan: To be determined Consults called: Neurologist, Dr. Gerilyn Pilgrimoonquah Admission status: Inpatient telemetry   [facility] - HiLLCrestFISHER,Quaran Kedzierski MD [facility] Pager [phone]  If 7PM-7AM, please contact night-coverage [email or web address] Password [email or web address]  03/16/2016, 5:12 PM

## 2016-03-16 NOTE — ED Notes (Signed)
Unable to obtain history at present. No family here and patient is unable to answer questions

## 2016-03-17 ENCOUNTER — Inpatient Hospital Stay (HOSPITAL_COMMUNITY): Payer: Medicare PPO

## 2016-03-17 ENCOUNTER — Inpatient Hospital Stay (HOSPITAL_COMMUNITY)
Admit: 2016-03-17 | Discharge: 2016-03-17 | Disposition: A | Discharge status: Home / Self Care | Payer: Medicare PPO | Attending: Neurology | Admitting: Neurology

## 2016-03-17 ENCOUNTER — Encounter (HOSPITAL_COMMUNITY): Payer: Self-pay | Admitting: Internal Medicine

## 2016-03-17 DIAGNOSIS — I6522 Occlusion and stenosis of left carotid artery: Secondary | ICD-10-CM | POA: Diagnosis present

## 2016-03-17 DIAGNOSIS — I6789 Other cerebrovascular disease: Secondary | ICD-10-CM

## 2016-03-17 HISTORY — DX: Occlusion and stenosis of left carotid artery: I65.22

## 2016-03-17 LAB — CBC
HCT: 36.3 % (ref 36.0–46.0)
Hemoglobin: 12.8 g/dL (ref 12.0–15.0)
MCH: 32.6 pg (ref 26.0–34.0)
MCHC: 35.3 g/dL (ref 30.0–36.0)
MCV: 92.4 fL (ref 78.0–100.0)
Platelets: 151 K/uL (ref 150–400)
RBC: 3.93 MIL/uL (ref 3.87–5.11)
RDW: 13.3 % (ref 11.5–15.5)
WBC: 7.5 K/uL (ref 4.0–10.5)

## 2016-03-17 LAB — BASIC METABOLIC PANEL
Anion gap: 6 (ref 5–15)
BUN: 8 mg/dL (ref 6–20)
CHLORIDE: 103 mmol/L (ref 101–111)
CO2: 26 mmol/L (ref 22–32)
Calcium: 8.9 mg/dL (ref 8.9–10.3)
Creatinine, Ser: 0.6 mg/dL (ref 0.44–1.00)
GFR calc Af Amer: >60 mL/min (ref >60)
GFR calc non Af Amer: >60 mL/min (ref >60)
GLUCOSE: 119 mg/dL — AB (ref 65–99)
POTASSIUM: 3.3 mmol/L — AB (ref 3.5–5.1)
SODIUM: 135 mmol/L (ref 135–145)

## 2016-03-17 LAB — LIPID PANEL
Cholesterol: 140 mg/dL (ref 0–200)
HDL: 51 mg/dL (ref >40)
LDL CALC: 75 mg/dL (ref 0–99)
Total CHOL/HDL Ratio: 2.7 RATIO
Triglycerides: 69 mg/dL (ref <150)
VLDL: 14 mg/dL (ref 0–40)

## 2016-03-17 LAB — VITAMIN B12: Vitamin B-12: 1523 pg/mL — ABNORMAL HIGH (ref 180–914)

## 2016-03-17 LAB — ECHOCARDIOGRAM COMPLETE
HEIGHTINCHES: 63 in
WEIGHTICAEL: 2160 [oz_av]

## 2016-03-17 LAB — APTT: aPTT: 56 s — ABNORMAL HIGH (ref 24–37)

## 2016-03-17 LAB — HEPARIN LEVEL (UNFRACTIONATED): Heparin Unfractionated: >2.2 [IU]/mL — ABNORMAL HIGH (ref 0.30–0.70)

## 2016-03-17 MED ORDER — SERTRALINE HCL 50 MG PO TABS
25.0000 mg | ORAL_TABLET | Freq: Every day | ORAL | Status: DC
  Administered 2016-03-17: 25 mg via ORAL
  Filled 2016-03-17: qty 1

## 2016-03-17 MED ORDER — METOPROLOL TARTRATE 25 MG PO TABS
25.0000 mg | ORAL_TABLET | Freq: Two times a day (BID) | ORAL | Status: DC
  Administered 2016-03-17: 25 mg via ORAL
  Filled 2016-03-17: qty 1

## 2016-03-17 MED ORDER — ATORVASTATIN CALCIUM 10 MG PO TABS
10.0000 mg | ORAL_TABLET | Freq: Every day | ORAL | Status: DC
  Administered 2016-03-17: 10 mg via ORAL
  Filled 2016-03-17: qty 1

## 2016-03-17 MED ORDER — POTASSIUM CHLORIDE CRYS ER 20 MEQ PO TBCR
30.0000 meq | EXTENDED_RELEASE_TABLET | Freq: Every day | ORAL | Status: AC
  Administered 2016-03-17: 30 meq via ORAL
  Filled 2016-03-17: qty 1

## 2016-03-17 MED ORDER — ATORVASTATIN CALCIUM 10 MG PO TABS: 10.0000 mg | ORAL_TABLET | Freq: Every day | ORAL | Status: AC

## 2016-03-17 MED ORDER — ALPRAZOLAM 0.25 MG PO TABS
0.2500 mg | ORAL_TABLET | Freq: Every day | ORAL | Status: DC
  Administered 2016-03-17: 0.25 mg via ORAL
  Filled 2016-03-17: qty 1

## 2016-03-17 MED ORDER — LOSARTAN POTASSIUM 50 MG PO TABS
25.0000 mg | ORAL_TABLET | Freq: Every day | ORAL | Status: DC
  Administered 2016-03-17: 25 mg via ORAL
  Filled 2016-03-17: qty 1

## 2016-03-17 NOTE — Progress Notes (Signed)
EEG Completed; Results Pending  

## 2016-03-17 NOTE — Procedures (Signed)
  [facility] Brad Mcgaughy A. Gerilyn Pilgrimoonquah, MD     [email or web address]           HISTORY: The patient is 82 year old female who presents with altered mental status. The study is being done to evaluate for seizures.  MEDICATIONS: Scheduled Meds: . ALPRAZolam  0.25 mg Oral Daily  . atorvastatin  10 mg Oral q1800  . losartan  25 mg Oral Daily  . metoprolol tartrate  25 mg Oral BID  . nicotine  14 mg Transdermal Daily  . rivaroxaban  15 mg Oral Daily  . sertraline  25 mg Oral Daily   Continuous Infusions:  PRN Meds:.acetaminophen, hydrALAZINE, levalbuterol, LORazepam  Prior to Admission medications   Medication Sig Start Date End Date Taking? Authorizing Provider  ALPRAZolam (XANAX) 0.25 MG tablet Take 0.25 mg by mouth daily.  01/26/16  Yes Historical Provider, MD  losartan (COZAAR) 50 MG tablet Take 50 mg by mouth daily.  01/12/16  Yes Historical Provider, MD  metoprolol tartrate (LOPRESSOR) 25 MG tablet Take 25 mg by mouth 2 (two) times daily.  12/21/15  Yes Historical Provider, MD  rivaroxaban (XARELTO) 20 MG TABS tablet Take 20 mg by mouth daily with supper.   Yes Historical Provider, MD  sertraline (ZOLOFT) 25 MG tablet Take 25 mg by mouth daily.   Yes Historical Provider, MD  atorvastatin (LIPITOR) 10 MG tablet Take 1 tablet (10 mg total) by mouth daily at 6 PM. 03/17/16   Elliot Cousinenise Fisher, MD      ANALYSIS: A 16 channel recording using standard 10 20 measurements is conducted for 21 minutes. There is a well-formed posterior dominant rhythm of 11 Hz which attenuates with eye opening. There is beta activity observed in the frontal areas. Awake and drowsy activities are observed. Photic simulation and hyperventilation was not conducted. There is no focal or lateral slowing. There is no epileptiform activity observed.     IMPRESSION: This is a normal study of the awake and drowsy states.      Shavar Gorka A. Gerilyn Pilgrimoonquah, M.D.  Diplomate, Biomedical engineerAmerican Board of Psychiatry and Neurology (  Neurology).

## 2016-03-17 NOTE — Progress Notes (Signed)
  Negative screened for acute swallow changes, continue prior dysphagia 3 (mechanical soft) and thin liquid diet  Marcene Duoshelsea Sumney MA, CCC-SLP Acute Care Speech Language Pathologist

## 2016-03-17 NOTE — Care Management Important Message (Signed)
Important Message  Patient Details  Name: Suzanne Pope MRN: [id number] Date of Birth: 11/01/1933   Medicare Important Message Given:  Yes    Adonis HugueninBerkhead, Naavya Postma L, RN 03/17/2016, 3:39 PM

## 2016-03-17 NOTE — Progress Notes (Signed)
Pt IV and telemetry removed, tolerated well.  Reviewed discharge instructions with pt and significant other.  Answered questions at this time.

## 2016-03-17 NOTE — Evaluation (Signed)
Speech Language Pathology Evaluation Patient Details Name: Suzanne Pope MRN: [id number] DOB: 08/06/1934 Today's Date: 03/17/2016 Time: [id number] SLP Time Calculation (min) (ACUTE ONLY): 30 min  Problem List:  Patient Active Problem List   Diagnosis Date Noted  . CVA (cerebral infarction) 03/16/2016  . Acute embolic stroke (HCC) 03/16/2016  . Expressive aphasia 03/16/2016  . Chronic atrial fibrillation (HCC) 03/16/2016  . Essential hypertension 03/16/2016  . Bacteriuria 03/16/2016  . Tobacco abuse 03/16/2016  . Alcohol use (HCC) 03/16/2016  . Depression with anxiety 03/16/2016   Past Medical History:  Past Medical History  Diagnosis Date  . Atrial fibrillation (HCC)   . COPD (chronic obstructive pulmonary disease) (HCC)   . Hypertension   . Anxiety   . Depression   . Hypercholesteremia    Past Surgical History: History reviewed. No pertinent past surgical history. HPI:  Suzanne Munne Quraishi is a 82 y.o. female with medical history significant for chronic atrial fibrillation on Xarelto, hypertension, COPD, anxiety with depression, and tobacco abuse who presents to the ED after her female friend, Mr. Aurther Lofterry, found her this morning at approximately 7:30 AM confused and unable to speak. (Patient has expressive aphasia, so her niece, Ms. Lorre MunroeJonas is providing the history along with the ED staff). Apparently, Mr. Aurther Lofterry found her this morning standing in the middle of the floor staring in space and unable to speak. He [place], but was told to come to the ED instead. The patient was able to ambulate with assistance. There was no reported incontinence of bladder bowels, seizure activity, or any type of trauma. Reportedly, the patient was in her usual state of health last night when Mr. Aurther Lofterry left her at 9:00 PM. It is unclear when her symptoms actually started. MRI of her brain revealed small acute left ACA and left MCA infarcts with greatest involvement of the frontal lobe and  moderate chronic small vessel ischemic disease.    Assessment / Plan / Recommendation Clinical Impression  Pt presents with mild anomic aphasia and moderate cognitive deficits of neurogenic etiology (left ACA and MCA  with frontal lobe territory involvement) Cognitive linguistic standardized assessment administered, MOCA: 13/30. PLOF pt was independent with all ADLs including complex ADLs (medicine & finance managment, driving). Pt lived alone though significant other was frequently visiting. Anomic aphasia characterized by mild word finding difficulty, decreased speed of information processing, mild phonemic paraphasias, and pragmatic involvement with decreased initiation for communicatory interactions. Cognitive deficts characterized by reduced recall of novel information, decreased executive function skills,decreased thought organization, and decreased mental flexibility. Pt reports "mental fog" since onset of CVA however displays decreased insight to implications of care at home. Recommend 24 hour care and assistance with all complex ADLS. Recommend outpatient ST services to address cognitive linguistic deficits mentioned.      SLP Assessment  Patient needs continued Speech Lanaguage Pathology Services    Follow Up Recommendations  Outpatient SLP;24 hour supervision/assistance    Frequency and Duration           SLP Evaluation Prior Functioning  Cognitive/Linguistic Baseline: Within functional limits Type of Home: House  Lives With: Alone Available Help at Discharge: Friend(s);Available PRN/intermittently Education: some college Vocation: Retired   IT consultantCognition  Overall Cognitive Status: Impaired/Different from baseline Arousal/Alertness: Awake/alert Orientation Level: Oriented to person;Oriented to place;Disoriented to time;Oriented to situation Memory: Impaired Memory Impairment: Decreased recall of new information;Retrieval deficit;Decreased short term memory Awareness:  Impaired Executive Function: Self Monitoring;Organizing Organizing: Impaired Self Monitoring: Impaired Safety/Judgment: Impaired  Comprehension  Auditory Comprehension Overall Auditory Comprehension: Appears within functional limits for tasks assessed    Expression Verbal Expression Overall Verbal Expression: Impaired Initiation: Impaired Naming: Impairment Verbal Errors: Phonemic paraphasias;Aware of errors Pragmatics: Impairment Impairments: Monotone Written Expression Dominant Hand: Right   Oral / Motor  Oral Motor/Sensory Function Overall Oral Motor/Sensory Function: Within functional limits Motor Speech Overall Motor Speech: Appears within functional limits for tasks assessed   GO                   Marcene Duos MA, CCC-SLP Acute Care Speech Language Pathologist    Kennieth Rad 03/17/2016, 5:50 PM

## 2016-03-17 NOTE — Care Management Note (Signed)
Case Management Note  Patient Details  Name: Nolen Munne Fickling MRN: [id number] Date of Birth: 08/29/1933  Subjective/Objective:  Spoke with patient and niece at the bedside. Patient is independent from home with family support.   Patient recommended for Outpatient PT and Speech. Referral placed [facility] 26th ath 330pm. Placed on discharge instructions and pateitn informed. No other CM needs           Action/Plan: home with self care.   Expected Discharge Date:                  Expected Discharge Plan:  Home/Self Care  In-House Referral:     Discharge planning Services  CM Consult, Follow-up appt scheduled  Post Acute Care Choice:  NA Choice offered to:  Patient  DME Arranged:  N/A DME Agency:  NA  HH Arranged:  NA HH Agency:     Status of Service:  Completed, signed off  If discussed at Long Length of Stay Meetings, dates discussed:    Additional Comments:  Adonis HugueninBerkhead, Arick Mareno L, RN 03/17/2016, 3:44 PM

## 2016-03-17 NOTE — Progress Notes (Signed)
  Echocardiogram 2D Echocardiogram has been performed.  Leta JunglingCooper, Wilhelmenia Addis M 03/17/2016, 3:17 PM

## 2016-03-17 NOTE — Discharge Summary (Signed)
Physician Discharge Summary  Suzanne Pope WUJ:[id number] DOB: 10/27/1933 DOA: 03/16/2016  PCP: ? Dr. Shela CommonsPerdon in [place]  Admit date: 03/16/2016 Discharge date: 03/17/2016  Time spent: Greater than 30 minutes  Recommendations for Outpatient Follow-up:  1. Patient was instructed to not drive indefinitely or when cleared by neurology. 2. Hormone replacement therapy was discontinued due to the stroke. 3. Outpatient ST, OT, and PT were ordered.    Discharge Diagnoses:  1. Acute left embolic strokes. 2. 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 3. Chronic atrial fibrillation, on Xarelto. 4. Essential hypertension. 5. Mild dyslipidemia. 6. Bacteriuria, but no UTI. 7. Tobacco abuse. 8. Alcohol use. 9. Depression with anxiety. 10. Left carotid artery stenosis-50-69%; less than 50% on the right  Discharge Condition: Stable  Diet recommendation: Heart healthy  Filed Weights   03/16/16 0910  Weight: 61.236 kg (135 lb)    History of present illness:  Patient is a 82 y.o. female with medical history significant for chronic atrial fibrillation on Xarelto, hypertension, COPD, anxiety with depression, and tobacco abuse who presented to the ED after her female friend, Mr. Aurther Lofterry, found her confused and unable to speak at home. In the ED, she was moderately hypertensive and mildly bradycardic. She was afebrile. MRI of her brain revealed small acute left ACA and left MCA infarcts with greatest involvement of the frontal lobe and moderate chronic small vessel ischemic disease. Her INR was 2.23. Urinalysis revealed many bacteria, nitrite, and small leukocytes. Urine drug screen was negative. She was admitted for further evaluation and management.  Hospital Course:   1. Acute left brain strokes, presumed to be embolic. On admission, it was unclear if the patient had been compliant with Xarelto and whether or not her strokes represented a Xarelto failure. Her INR  was 2.3 on admission. Pending her swallow evaluation, she was started on intravenous heparin for anticoagulation. Dr. Gerilyn Pilgrimoonquah, neurologist was consulted. He agreed that the stroke was likely embolic. He also considered possibility of the patient having a seizure. Therefore, EEG was ordered. Heparin was discontinued and Xarelto was restarted-he was unclear if switching her to another anticoagulant was of any particular value and adding aspirin was not recommended due to increased risk of bleeding. -Investigational studies revealed a relatively normal lipid panel, but Lipitor was started in the setting of an acute stroke. TSH was within normal limits. 2-D echo revealed an EF of 55-60% and no obvious PFO or ASD. Carotid ultrasound revealed no ICA stenosis on the right, but 50-69% stenosis on the left. B12 level was actually elevated. -PT and OT evaluated the patient and recommended outpatient therapy. This was ordered. -The patient was discharged home, but her significant other and family will be either stay with her or checking on her closely. The patient was distracted and not to drive. She was instructed to follow-up with Dr. Gerilyn Pilgrimoonquah in 2-4 weeks.  Expressive aphasia secondary to left brain embolic strokes. On admission, the patient had obvious expressive aphasia, but occasionally words broke through when she was attempting to speak. Her expressive aphasia improved as she was able to verbalize her name, year, place, and was able to report some past medical history. She passed a swallow evaluation and her diet was advanced. -The speech therapist evaluated her and noted that the patient had mild anomaly aphasia and moderate cognitive linguistic deficits. She recommended outpatient ST services and 24-hour care. Her significant other and family members will be either staying with her or close by.  History  of estrogen replacement therapy. The patient had been treated with an estrogen patch. It was  discontinued in the setting of the strokes. This was explained to her her and her family.  Tobacco abuse. Nicotine patch was placed. Patient was encouraged to stop smoking. Alcohol use. Patient reports just 1 alcoholic beverage several days weekly. Her significant other acknowledges this.  Hypertension. Patient is treated chronically with losartan and metoprolol. Both were withheld due to aphasia and pending swallow evaluation. When necessary hydralazine was ordered. Her blood pressures were mildly to moderately elevated. Her chronic medications were restarted.  Depression with anxiety. The patient is treated chronically with alprazolam and Zoloft. They were restarted. restarted.  Procedures: 1.)  EEG, pending. Dr. Gerilyn Pilgrim will follow up on the results. 2.)  2-D echocardiogram on 03/17/16: Impressions: - Normal LV wall thickness with LVEF 55-60%. Grade 1 diastolic  dysfunction with normal estimated LV filling pressure. MAC with  mild mitral regurgitation. Sclerotic aortic valve without  stenosis. Trivial tricuspid regurgitation with PASP 34 mmHg. No  obvious PFO or ASD.  Consultations:  Neurologist Dr. Gerilyn Pilgrim  Discharge Exam: Filed Vitals:   03/17/16 1401 03/17/16 1533  BP: 165/56 168/58  Pulse: 62 60  Temp: 97.5 F (36.4 C) 98.2 F (36.8 C)  Resp: 20 18  Oxygen saturation 98% on room air.  General: Alert 82 year old woman in no acute distress. She looks better. Cardiovascular: Irregular, irregular regular. Respiratory: Decreased breath sounds in the bases, otherwise clear. Mild rattly cough. Neurologic: She has resolving expressive aphasia. She is now able to verbalize her name, name of hospital, month, and year. She has less of a right-sided facial droop.  Discharge Instructions   Discharge Instructions    Diet - low sodium heart healthy    Complete by:  As directed      Discharge instructions    Complete by:  As directed   Stop smoking. Drink no more than 1  alcoholic beverage every other day. Discontinue the estrogen patch.     Driving Restrictions    Complete by:  As directed   Do not drive.     Increase activity slowly    Complete by:  As directed           Current Discharge Medication List    START taking these medications   Details  atorvastatin (LIPITOR) 10 MG tablet Take 1 tablet (10 mg total) by mouth daily at 6 PM. Qty: 30 tablet, Refills: 3      CONTINUE these medications which have NOT CHANGED   Details  ALPRAZolam (XANAX) 0.25 MG tablet Take 0.25 mg by mouth daily.     losartan (COZAAR) 50 MG tablet Take 50 mg by mouth daily.     metoprolol tartrate (LOPRESSOR) 25 MG tablet Take 25 mg by mouth 2 (two) times daily.     rivaroxaban (XARELTO) 20 MG TABS tablet Take 20 mg by mouth daily with supper.    sertraline (ZOLOFT) 25 MG tablet Take 25 mg by mouth daily.      STOP taking these medications     estradiol (CLIMARA - DOSED IN MG/24 HR) 0.025 mg/24hr patch        Allergies  Allergen Reactions  . Augmentin [Amoxicillin-Pot Clavulanate] Anaphylaxis  . Codeine Nausea Only  . Erythromycin    Follow-up Information    Follow up with [facility]. Go on 03/22/2016.   Specialties:  Physical Therapy, Occupational Therapy   Why:  At 3:30 pm  bring Photo  ID   Contact information:   [address] [place] [address] [phone]       Schedule an appointment as soon as possible for a visit with [facility], Darleen Crocker, MD.   Specialty:  Neurology   Why:  Neurologist to be seen in 2-4 weeks.   Contact information:   [address] [place] [address] [phone]       Please follow up.   Why:  Follow-up with your primary care physician in 1-2 weeks.       The results of significant diagnostics from this hospitalization (including imaging, microbiology, ancillary and laboratory) are listed below for reference.    Significant Diagnostic Studies: Dg Chest 1 View  03/16/2016   CLINICAL DATA:  Acute embolic stroke. EXAM: CHEST 1 VIEW COMPARISON:  None. FINDINGS: Cardiomediastinal silhouette is normal. Mediastinal contours appear intact. Tortuosity and calcific atherosclerotic disease of the aorta are noted. There is no evidence of focal airspace consolidation, pleural effusion or pneumothorax. There is mild coarsening of interstitial markings. Osseous structures are without acute abnormality. Soft tissues are grossly normal. IMPRESSION: Mild chronic interstitial lung disease. Atherosclerotic disease of the aorta. Electronically Signed   By: Ted Mcalpine M.D.   On: 03/16/2016 19:14   Ct Head Wo Contrast  03/16/2016  CLINICAL DATA:  Altered mental status.  Stroke symptoms. EXAM: CT HEAD WITHOUT CONTRAST TECHNIQUE: Contiguous axial images were obtained from the base of the skull through the vertex without intravenous contrast. COMPARISON:  None. FINDINGS: Brain: Scattered minimal periventricular hypodensities compatible microvascular ischemic disease. The gray-white differentiation is otherwise well maintained without CT evidence of acute large territory infarct. No intraparenchymal or extra-axial mass or hemorrhage. Normal size and configuration of the ventricles and basilar cisterns. No midline shift. Vascular: No hyperdense vessel or unexpected calcification. Skull: No displaced calvarial fracture. Sinuses/Orbits: Limited visualization the paranasal sinuses and mastoid air cells is normal. No air-fluid levels. Post bilateral cataract surgery. Other: Regional soft tissues appear normal. IMPRESSION: Microvascular ischemic disease without acute intracranial process. Electronically Signed   By: Simonne Come M.D.   On: 03/16/2016 10:25   Mr Brain Wo Contrast  03/16/2016  CLINICAL DATA:  Aphasia.  Altered mental status. EXAM: MRI HEAD WITHOUT CONTRAST TECHNIQUE: Multiplanar, multiecho pulse sequences of the brain and surrounding structures were obtained without intravenous contrast.  COMPARISON:  Head CT 03/16/2016 FINDINGS: There are small acute infarcts in the left cerebral hemisphere involving both the ACA and MCA territories. The largest infarcts are cortically based in the medial left frontal lobe and cingulate gyrus and measure up to 2.5 cm in size. Additional subcentimeter infarcts are noted slightly more laterally in the posterior left frontal lobe as well as in the region of the left caudate body and posterior left temporal lobe. There is no evidence of acute intracranial hemorrhage, mass, midline shift, or extra-axial fluid collection. There may be a chronic microhemorrhage in the left temporal lobe. Patchy to confluent T2 hyperintensities in the deep cerebral white matter bilaterally are nonspecific but compatible with moderate chronic small vessel ischemic disease. Mild chronic ischemic changes are noted in the pons and thalami. There are tiny chronic infarcts in the cerebellum bilaterally as well as basal ganglia. There is mild generalized cerebral atrophy. Prior bilateral cataract extraction is noted. There are trace bilateral mastoid effusions. The paranasal sinuses are clear. Major intracranial vascular flow voids are preserved. IMPRESSION: 1. Small acute left ACA and left MCA infarcts with greatest involvement of the frontal lobe and cingulate gyrus. 2.  Moderate chronic small vessel ischemic disease. Electronically Signed   By: Sebastian Ache M.D.   On: 03/16/2016 11:54   US Carotid Bilateral  03/17/2016  CLINICAL DATA:  Aphasia EXAM: BILATERAL CAROTID DUPLEX ULTRASOUND TECHNIQUE: Wallace Cullens scale imaging, color Doppler and duplex ultrasound were performed of bilateral carotid and vertebral arteries in the neck. COMPARISON:  None. FINDINGS: Criteria: Quantification of carotid stenosis is based on velocity parameters that correlate the residual internal carotid diameter with NASCET-based stenosis levels, using the diameter of the distal internal carotid lumen as the denominator for  stenosis measurement. The following velocity measurements were obtained: RIGHT ICA:  78 cm/sec CCA:  77 cm/sec SYSTOLIC ICA/CCA RATIO:  1.0 DIASTOLIC ICA/CCA RATIO:  1.8 ECA:  61 cm/sec LEFT ICA:  126 cm/sec CCA:  85 cm/sec SYSTOLIC ICA/CCA RATIO:  1.5 DIASTOLIC ICA/CCA RATIO:  1.7 ECA:  140 cm/sec RIGHT CAROTID ARTERY: Minimal plaque in the bulb. Low resistance internal carotid Doppler pattern. RIGHT VERTEBRAL ARTERY:  Antegrade. LEFT CAROTID ARTERY: Prominent focal calcified plaque in the bulb. Low resistance internal carotid Doppler pattern. LEFT VERTEBRAL ARTERY:  Antegrade. IMPRESSION: Less than 50% stenosis in the right internal carotid artery 50-69% stenosis in the left internal carotid artery secondary to focal calcified plaque in the bulb. Electronically Signed   By: Jolaine Click M.D.   On: 03/17/2016 10:38    Microbiology: No results found for this or any previous visit (from the past 240 hour(s)).   Labs: Basic Metabolic Panel:  Recent Labs Lab 03/16/16 0903 03/16/16 0918 03/17/16 0254  NA 133* 135 135  K 4.1 4.2 3.3*  CL 99* 98* 103  CO2 25  --  26  GLUCOSE 129* 124* 119*  BUN 10 8 8   CREATININE 0.66 0.60 0.60  CALCIUM 9.4  --  8.9   Liver Function Tests:  Recent Labs Lab 03/16/16 0903  AST 24  ALT 19  ALKPHOS 47  BILITOT 0.6  PROT 7.9  ALBUMIN 4.5   No results for input(s): LIPASE, AMYLASE in the last 168 hours. No results for input(s): AMMONIA in the last 168 hours. CBC:  Recent Labs Lab 03/16/16 0903 03/16/16 0918 03/17/16 0254  WBC 9.8  --  7.5  NEUTROABS 8.2*  --   --   HGB 13.9 14.3 12.8  HCT 39.5 42.0 36.3  MCV 92.5  --  92.4  PLT 163  --  151   Cardiac Enzymes: No results for input(s): CKTOTAL, CKMB, CKMBINDEX, TROPONINI in the last 168 hours. BNP: BNP (last 3 results) No results for input(s): BNP in the last 8760 hours.  ProBNP (last 3 results) No results for input(s): PROBNP in the last 8760 hours.  CBG:  Recent Labs Lab  03/16/16 0905  GLUCAP 131*       Signed:  Gaines Cartmell MD.  [facility] 03/17/2016, 6:03 PM

## 2016-03-17 NOTE — Evaluation (Addendum)
Physical Therapy Evaluation Patient Details Name: Suzanne Pope MRN: [id number] DOB: [birth year] Today's Date: 03/17/2016   History of Present Illness  Suzanne Pope is a 82 y.o. female with medical history significant for chronic atrial fibrillation on Xarelto, hypertension, COPD, anxiety with depression, and tobacco abuse who presents to the ED after her female friend, Mr. Coralyn Mark, found her this morning at approximately 7:30 AM confused and unable to speak. (Patient has expressive aphasia, so her niece, Ms. Suzanne Pope is providing the history along with the ED staff). Apparently, Mr. Coralyn Mark found her this morning standing in the middle of the floor staring in space and unable to speak. He took her to the urgent care in [place], but was told to come to the ED instead. The patient was able to ambulate with assistance. There was no reported incontinence of bladder bowels, seizure activity, or any type of trauma. Reportedly, the patient was in her usual state of health last night when Mr. Coralyn Mark left her at 9:00 PM. It is unclear when her symptoms actually started. MRI of her brain revealed small acute left ACA and left MCA infarcts with greatest involvement of the frontal lobe and moderate chronic small vessel ischemic disease.   Clinical Impression  Pt received in bed, and was agreeable to PT evaluation.  Pt is able to answer most questions, however she requires increased time for processing and response.  Prior to being admitted to the hospital, she was independent with ambulation, living alone with a long time friend that comes by every day.  She was performing all ADL's and IADL's independently including driving, and running errands.  Today during evaluation she demonstrates bed mobility independently, sit<>stand independently, and ambulation x 558f with supervision.  Pt demonstrates occasional scissor step after being asked to perform other balance activity.  She scored a 48/56 on the BERG balance test which places her  at moderate risk for falling.  At this point, she is recommended for OPPT to progress strength, and higher level balance activities.  Will d/c acute PT at this time.     Follow Up Recommendations Outpatient PT (for balance training. )    Equipment Recommendations  None recommended by PT    Recommendations for Other Services       Precautions / Restrictions Precautions Precautions: None Restrictions Weight Bearing Restrictions: No      Mobility  Bed Mobility Overal bed mobility: Modified Independent                Transfers Overall transfer level: Modified independent                  Ambulation/Gait                Stairs            Wheelchair Mobility    Modified Rankin (Stroke Patients Only) Modified Rankin (Stroke Patients Only) Pre-Morbid Rankin Score: No symptoms Modified Rankin: Slight disability     Balance Overall balance assessment: Needs assistance                               Standardized Balance Assessment Standardized Balance Assessment : Berg Balance Test Berg Balance Test Sit to Stand: Able to stand without using hands and stabilize independently Standing Unsupported: Able to stand safely 2 minutes Sitting with Back Unsupported but Feet Supported on Floor or Stool: Able to sit safely and securely 2 minutes Stand to Sit: Sits safely  with minimal use of hands Transfers: Able to transfer safely, minor use of hands Standing Unsupported with Eyes Closed: Able to stand 10 seconds safely Standing Ubsupported with Feet Together: Able to place feet together independently and stand 1 minute safely From Standing, Reach Forward with Outstretched Arm: Can reach forward >12 cm safely (5") From Standing Position, Pick up Object from Floor: Able to pick up shoe safely and easily From Standing Position, Turn to Look Behind Over each Shoulder: Looks behind from both sides and weight shifts well Turn 360 Degrees: Able to turn  360 degrees safely in 4 seconds or less Standing Unsupported, Alternately Place Feet on Step/Stool: Able to stand independently and complete 8 steps >20 seconds Standing Unsupported, One Foot in Front: Able to take small step independently and hold 30 seconds Standing on One Leg: Unable to try or needs assist to prevent fall Total Score: 48         Pertinent Vitals/Pain Pain Assessment: 0-10 Pain Score: 3  Pain Location: R shoulder "when I move it, it hurts."  Pt is unable to describe the pain.  Pain Intervention(s): Limited activity within patient's tolerance    Home Living Family/patient expects to be discharged to:: Private residence Living Arrangements: Alone Available Help at Discharge:  (long time friend comes 1x per week) Type of Home: House Home Access: Stairs to enter Entrance Stairs-Rails: Right Entrance Stairs-Number of Steps: Back 2, but no steps goign in the front  Home Layout: Two level;Able to live on main level with bedroom/bathroom Home Equipment: None      Prior Function Level of Independence: Independent   Gait / Transfers Assistance Needed: Pt ambulates without assistance   ADL's / Homemaking Assistance Needed: independent with dressing and bathing, still driving, running her own errands.         Hand Dominance   Dominant Hand: Right    Extremity/Trunk Assessment   Upper Extremity Assessment: Overall WFL for tasks assessed           Lower Extremity Assessment: Defer to PT evaluation      Cervical / Trunk Assessment: Normal  Communication   Communication: Expressive difficulties  Cognition Arousal/Alertness: Awake/alert Behavior During Therapy: WFL for tasks assessed/performed Overall Cognitive Status: Within Functional Limits for tasks assessed                      General Comments      Exercises        Assessment/Plan    PT Assessment All further PT needs can be met in the next venue of care  PT Diagnosis Difficulty  walking   PT Problem List Decreased strength;Decreased balance;Decreased mobility;Decreased activity tolerance  PT Treatment Interventions     PT Goals (Current goals can be found in the Care Plan section) Acute Rehab PT Goals PT Goal Formulation: All assessment and education complete, DC therapy    Frequency     Barriers to discharge        Co-evaluation               End of Session Equipment Utilized During Treatment: Gait belt Activity Tolerance: Patient tolerated treatment well Patient left: in chair Nurse Communication: Mobility status    Functional Assessment Tool Used: [employer]	[place] AM [facility] "6-clicks"  Functional Limitation: Mobility: Walking and moving around Mobility: Walking and Moving Around Current Status [phone]): At least 20 percent but less than 40 percent impaired, limited or restricted Mobility: Walking and Moving Around Goal Status [phone]):  At least 20 percent but less than 40 percent impaired, limited or restricted Mobility: Walking and Moving Around Discharge Status [phone]): At least 20 percent but less than 40 percent impaired, limited or restricted    Time: 6045-4098 PT Time Calculation (min) (ACUTE ONLY): 26 min   Charges:   PT Evaluation $PT Eval Low Complexity: 1 Procedure PT Treatments $Gait Training: 8-22 mins   PT G Codes:   PT G-Codes **NOT FOR INPATIENT CLASS** Functional Assessment Tool Used: [employer]	[place] AM PAC "6-clicks"  Functional Limitation: Mobility: Walking and moving around Mobility: Walking and Moving Around Current Status [phone]): At least 20 percent but less than 40 percent impaired, limited or restricted Mobility: Walking and Moving Around Goal Status [phone]): At least 20 percent but less than 40 percent impaired, limited or restricted Mobility: Walking and Moving Around Discharge Status [phone]): At least 20 percent but less than 40 percent impaired, limited or restricted    Beth Derian Pfost, PT, DPT X: [phone]

## 2016-03-17 NOTE — Progress Notes (Signed)
[place] A. Merlene Laughter, MD     [email or web address]          Glendale Wherry is an 82 y.o. female.   Assessment/Plan: 1. 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 The MRI features are limited to the cortical ribbon and does not involve one particular vascular territory. This raises concerns for the possibility of seizures especially given the patient presentation with confusion. 2. Chronic atrial fibrillation. 3. Hypertension. 4. Dyslipidemia. 5. Asymptomatic mild left ICA stenosis.   RECOMMENDATION: FU 2 weeks Continue with Xarelto.  It is unclear if switching to another anticoagulation agent is of any particular value. Certainly added aspirin is not recommended given the increased risk of bleeding.   The patient is much improved today.    GENERAL: Pleasant thin female in no acute distress.  HEENT: Normal  ABDOMEN: soft  EXTREMITIES: No edema   BACK: Normal  SKIN: Normal by inspection.   MENTAL STATUS: She is awake and alert. Fluency is improved and comprehension is good. Naming is good. She is lucid and follows commands briskly. She knows the season the hospital and that she is having a stroke.  CRANIAL NERVES: Pupils are equal, round and reactive to light and accomodation; extra ocular movements are full, there is no significant nystagmus; visual fields are full; upper and lower facial muscles are normal in strength and symmetric, there is no flattening of the nasolabial folds; tongue is midline; uvula is midline; shoulder elevation is normal.  MOTOR: She has deltoid weakness bilaterally. There is a mild drift involving the right upper extremity and right leg. Leg strength is 5/5. Bulk and tone are normal.   COORDINATION: Left finger to nose is normal, right finger to nose is normal, No rest tremor; no intention tremor; no postural tremor; no bradykinesia.       Objective: Vital signs in last 24 hours: Temp:  [97.5 F (36.4  C)-98.7 F (37.1 C)] 98.2 F (36.8 C) (07/21 1533) Pulse Rate:  [55-78] 60 (07/21 1533) Resp:  [18-20] 18 (07/21 1533) BP: (156-177)/(47-77) 168/58 mmHg (07/21 1533) SpO2:  [94 %-98 %] 98 % (07/21 1533)  Intake/Output from previous day: 07/20 0701 - 07/21 0700 In: 752.9 [I.V.:752.9] Out: -  Intake/Output this shift:   Nutritional status: DIET DYS 3 Room service appropriate?: Yes; Fluid consistency:: Thin   Lab Results: Results for orders placed or performed during the hospital encounter of 03/16/16 (from the past 48 hour(s))  Ethanol     Status: None   Collection Time: 03/16/16  9:03 AM  Result Value Ref Range   Alcohol, Ethyl (B) <5 <5 mg/dL    Comment:        LOWEST DETECTABLE LIMIT FOR SERUM ALCOHOL IS 5 mg/dL FOR MEDICAL PURPOSES ONLY   Protime-INR     Status: Abnormal   Collection Time: 03/16/16  9:03 AM  Result Value Ref Range   Prothrombin Time 24.5 (H) 11.6 - 15.2 seconds   INR 2.23 (H) 0.00 - 1.49  [name]     Status: Abnormal   Collection Time: 03/16/16  9:03 AM  Result Value Ref Range   [name] 52 (H) 24 - 37 seconds    Comment:        IF BASELINE [name] IS ELEVATED, SUGGEST PATIENT RISK ASSESSMENT BE USED TO DETERMINE APPROPRIATE ANTICOAGULANT THERAPY.   CBC     Status: None   Collection Time: 03/16/16  9:03 AM  Result Value Ref Range   WBC 9.8 4.0 - 10.5 K/uL  RBC 4.27 3.87 - 5.11 MIL/uL   Hemoglobin 13.9 12.0 - 15.0 g/dL   HCT 39.5 36.0 - 46.0 %   MCV 92.5 78.0 - 100.0 fL   MCH 32.6 26.0 - 34.0 pg   MCHC 35.2 30.0 - 36.0 g/dL   RDW 13.2 11.5 - 15.5 %   Platelets 163 150 - 400 K/uL  Differential     Status: Abnormal   Collection Time: 03/16/16  9:03 AM  Result Value Ref Range   Neutrophils Relative % 84 %   Neutro Abs 8.2 (H) 1.7 - 7.7 K/uL   Lymphocytes Relative 11 %   Lymphs Abs 1.1 0.7 - 4.0 K/uL   Monocytes Relative 5 %   Monocytes Absolute 0.5 0.1 - 1.0 K/uL   Eosinophils Relative 0 %   Eosinophils Absolute 0.0 0.0 - 0.7 K/uL   Basophils  Relative 0 %   Basophils Absolute 0.0 0.0 - 0.1 K/uL  Comprehensive metabolic panel     Status: Abnormal   Collection Time: 03/16/16  9:03 AM  Result Value Ref Range   Sodium 133 (L) 135 - 145 mmol/L   Potassium 4.1 3.5 - 5.1 mmol/L   Chloride 99 (L) 101 - 111 mmol/L   CO2 25 22 - 32 mmol/L   Glucose, Bld 129 (H) 65 - 99 mg/dL   BUN 10 6 - 20 mg/dL   Creatinine, Ser 0.66 0.44 - 1.00 mg/dL   Calcium 9.4 8.9 - 10.3 mg/dL   Total Protein 7.9 6.5 - 8.1 g/dL   Albumin 4.5 3.5 - 5.0 g/dL   AST 24 15 - 41 U/L   ALT 19 14 - 54 U/L   Alkaline Phosphatase 47 38 - 126 U/L   Total Bilirubin 0.6 0.3 - 1.2 mg/dL   GFR calc non Af Amer >60 >60 mL/min   GFR calc Af Amer >60 >60 mL/min    Comment: (NOTE) The eGFR has been calculated using the CKD EPI equation. This calculation has not been validated in all clinical situations. eGFR's persistently <60 mL/min signify possible Chronic Kidney Disease.    Anion gap 9 5 - 15  TSH     Status: None   Collection Time: 03/16/16  9:03 AM  Result Value Ref Range   TSH 0.865 0.350 - 4.500 uIU/mL  CBG monitoring, ED     Status: Abnormal   Collection Time: 03/16/16  9:05 AM  Result Value Ref Range   Glucose-Capillary 131 (H) 65 - 99 mg/dL  I-stat troponin, ED (not at [facility], [facility])     Status: None   Collection Time: 03/16/16  9:16 AM  Result Value Ref Range   Troponin i, poc 0.00 0.00 - 0.08 ng/mL   Comment 3            Comment: Due to the release kinetics of cTnI, a negative result within the first hours of the onset of symptoms does not rule out myocardial infarction with certainty. If myocardial infarction is still suspected, repeat the test at appropriate intervals.   I-Stat Chem 8, ED  (not at [facility], [facility])     Status: Abnormal   Collection Time: 03/16/16  9:18 AM  Result Value Ref Range   Sodium 135 135 - 145 mmol/L   Potassium 4.2 3.5 - 5.1 mmol/L   Chloride 98 (L) 101 - 111 mmol/L   BUN 8 6 - 20 mg/dL   Creatinine, Ser 0.60 0.44 - 1.00 mg/dL    Glucose, Bld 124 (H) 65 -  99 mg/dL   Calcium, Ion 1.19 1.12 - 1.23 mmol/L   TCO2 26 0 - 100 mmol/L   Hemoglobin 14.3 12.0 - 15.0 g/dL   HCT 42.0 36.0 - 46.0 %  Urine rapid drug screen (hosp performed)not at [facility]     Status: None   Collection Time: 03/16/16  1:24 PM  Result Value Ref Range   Opiates NONE DETECTED NONE DETECTED   Cocaine NONE DETECTED NONE DETECTED   Benzodiazepines NONE DETECTED NONE DETECTED   Amphetamines NONE DETECTED NONE DETECTED   Tetrahydrocannabinol NONE DETECTED NONE DETECTED   Barbiturates NONE DETECTED NONE DETECTED    Comment:        DRUG SCREEN FOR MEDICAL PURPOSES ONLY.  IF CONFIRMATION IS NEEDED FOR ANY PURPOSE, NOTIFY LAB WITHIN 5 DAYS.        LOWEST DETECTABLE LIMITS FOR URINE DRUG SCREEN Drug Class       Cutoff (ng/mL) Amphetamine      1000 Barbiturate      200 Benzodiazepine   993 Tricyclics       570 Opiates          300 Cocaine          300 THC              50   Urinalysis, Routine w reflex microscopic (not at [facility])     Status: Abnormal   Collection Time: 03/16/16  1:24 PM  Result Value Ref Range   Color, Urine YELLOW YELLOW   APPearance CLEAR CLEAR   Specific Gravity, Urine 1.020 1.005 - 1.030   pH 6.0 5.0 - 8.0   Glucose, UA NEGATIVE NEGATIVE mg/dL   Hgb urine dipstick MODERATE (A) NEGATIVE   Bilirubin Urine NEGATIVE NEGATIVE   Ketones, ur 15 (A) NEGATIVE mg/dL   Protein, ur NEGATIVE NEGATIVE mg/dL   Nitrite POSITIVE (A) NEGATIVE   Leukocytes, UA SMALL (A) NEGATIVE  Urine microscopic-add on     Status: Abnormal   Collection Time: 03/16/16  1:24 PM  Result Value Ref Range   Squamous Epithelial / LPF 6-30 (A) NONE SEEN   WBC, UA 6-30 0 - 5 WBC/hpf   RBC / HPF 6-30 0 - 5 RBC/hpf   Bacteria, UA MANY (A) NONE SEEN  Heparin level (unfractionated)     Status: Abnormal   Collection Time: 03/16/16  5:04 PM  Result Value Ref Range   Heparin Unfractionated 1.97 (H) 0.30 - 0.70 IU/mL    Comment: RESULTS CONFIRMED BY MANUAL  DILUTION        IF HEPARIN RESULTS ARE BELOW EXPECTED VALUES, AND PATIENT DOSAGE HAS BEEN CONFIRMED, SUGGEST FOLLOW UP TESTING OF ANTITHROMBIN III LEVELS.   Vitamin B12     Status: Abnormal   Collection Time: 03/16/16  5:04 PM  Result Value Ref Range   Vitamin B-12 1523 (H) 180 - 914 pg/mL    Comment: (NOTE) This assay is not validated for testing neonatal or myeloproliferative syndrome specimens for Vitamin B12 levels. Performed at [facility]   Lipid panel     Status: None   Collection Time: 03/17/16  2:54 AM  Result Value Ref Range   Cholesterol 140 0 - 200 mg/dL   Triglycerides 69 <150 mg/dL   HDL 51 >40 mg/dL   Total CHOL/HDL Ratio 2.7 RATIO   VLDL 14 0 - 40 mg/dL   LDL Cholesterol 75 0 - 99 mg/dL    Comment:        Total Cholesterol/HDL:CHD Risk Coronary Heart  Disease Risk Table                     Men   Women  1/2 Average Risk   3.4   3.3  Average Risk       5.0   4.4  2 X Average Risk   9.6   7.1  3 X Average Risk  23.4   11.0        Use the calculated Patient Ratio above and the CHD Risk Table to determine the patient's CHD Risk.        ATP III CLASSIFICATION (LDL):  <100     mg/dL   Optimal  100-129  mg/dL   Near or Above                    Optimal  130-159  mg/dL   Borderline  160-189  mg/dL   High  >190     mg/dL   Very High   Basic metabolic panel     Status: Abnormal   Collection Time: 03/17/16  2:54 AM  Result Value Ref Range   Sodium 135 135 - 145 mmol/L   Potassium 3.3 (L) 3.5 - 5.1 mmol/L    Comment: DELTA CHECK NOTED   Chloride 103 101 - 111 mmol/L   CO2 26 22 - 32 mmol/L   Glucose, Bld 119 (H) 65 - 99 mg/dL   BUN 8 6 - 20 mg/dL   Creatinine, Ser 0.60 0.44 - 1.00 mg/dL   Calcium 8.9 8.9 - 10.3 mg/dL   GFR calc non Af Amer >60 >60 mL/min   GFR calc Af Amer >60 >60 mL/min    Comment: (NOTE) The eGFR has been calculated using the CKD EPI equation. This calculation has not been validated in all clinical situations. eGFR's  persistently <60 mL/min signify possible Chronic Kidney Disease.    Anion gap 6 5 - 15  CBC     Status: None   Collection Time: 03/17/16  2:54 AM  Result Value Ref Range   WBC 7.5 4.0 - 10.5 K/uL   RBC 3.93 3.87 - 5.11 MIL/uL   Hemoglobin 12.8 12.0 - 15.0 g/dL   HCT 36.3 36.0 - 46.0 %   MCV 92.4 78.0 - 100.0 fL   MCH 32.6 26.0 - 34.0 pg   MCHC 35.3 30.0 - 36.0 g/dL   RDW 13.3 11.5 - 15.5 %   Platelets 151 150 - 400 K/uL  Heparin level (unfractionated)     Status: Abnormal   Collection Time: 03/17/16  2:54 AM  Result Value Ref Range   Heparin Unfractionated >2.20 (H) 0.30 - 0.70 IU/mL    Comment: RESULTS CONFIRMED BY MANUAL DILUTION        IF HEPARIN RESULTS ARE BELOW EXPECTED VALUES, AND PATIENT DOSAGE HAS BEEN CONFIRMED, SUGGEST FOLLOW UP TESTING OF ANTITHROMBIN III LEVELS.   [name]     Status: Abnormal   Collection Time: 03/17/16  2:54 AM  Result Value Ref Range   [name] 56 (H) 24 - 37 seconds    Comment:        IF BASELINE [name] IS ELEVATED, SUGGEST PATIENT RISK ASSESSMENT BE USED TO DETERMINE APPROPRIATE ANTICOAGULANT THERAPY.     Lipid Panel  Recent Labs  03/17/16 0254  CHOL 140  TRIG 69  HDL 51  CHOLHDL 2.7  VLDL 14  LDLCALC 75    Studies/Results:  CAROTID DOPPLERS   L ICA 96-29 % - systolic velocity 528  and index 1.5   Medications:  Scheduled Meds: . ALPRAZolam  0.25 mg Oral Daily  . losartan  25 mg Oral Daily  . metoprolol tartrate  25 mg Oral BID  . nicotine  14 mg Transdermal Daily  . rivaroxaban  15 mg Oral Daily  . sertraline  25 mg Oral Daily   Continuous Infusions: . dextrose 5 % and 0.9% NaCl 40 mL/hr at 03/17/16 1530   PRN Meds:.acetaminophen, hydrALAZINE, levalbuterol, LORazepam     LOS: 1 day   [place] [place] A. Merlene Laughter, M.D.  Diplomate, Tax adviser of Psychiatry and Neurology ( Neurology).

## 2016-03-17 NOTE — Evaluation (Signed)
Occupational Therapy Evaluation Patient Details Name: Suzanne Pope MRN: [id number] DOB: 01/09/1934 Today's Date: 03/17/2016    History of Present Illness Suzanne Munne Backstrom is a 82 y.o. female with medical history significant for chronic atrial fibrillation on Xarelto, hypertension, COPD, anxiety with depression, and tobacco abuse who presents to the ED after her female friend, Mr. Aurther Lofterry, found her this morning at approximately 7:30 AM confused and unable to speak.  Reportedly, the patient was in her usual state of health last night when Mr. Aurther Lofterry left her at 9:00 PM. It is unclear when her symptoms actually started. MRI of her brain revealed small acute left ACA and left MCA infarcts with greatest involvement of the frontal lobe and moderate chronic small vessel ischemic disease.    Clinical Impression   Pt awake, alert, oriented x4 this am, agreeable to OT evaluation; pt's friend "Aurther Lofterry" present for evaluation. Pt and friend report pt is much improved this am, pt is speaking in short sentences, continues to require increased time for word finding due to expressive aphasia. Pt answers questions appropriately. Pt able to complete ADL tasks with supervision for safety, no physical assistance or verbal cuing required. Pt's BUE strength is WFL, coordination and sensation are intact. Pt's friend Aurther Lofterry is with pt from approximately 6:30am to 9:00pm daily. Pt would prefer outpatient rehab services if necessary. No further OT services required at this time as pt has adequate supervision at home.     Follow Up Recommendations  No OT follow up    Equipment Recommendations  None recommended by OT       Precautions / Restrictions Precautions Precautions: None Restrictions Weight Bearing Restrictions: No      Mobility Bed Mobility Overal bed mobility: Modified Independent                Transfers Overall transfer level: Modified independent                         ADL Overall ADL's :  Needs assistance/impaired Eating/Feeding: Modified independent;Sitting   Grooming: Wash/dry hands;Supervision/safety;Standing                   Toilet Transfer: Supervision/safety;Regular Social workerToilet   Toileting- Clothing Manipulation and Hygiene: Modified independent;Sit to/from stand       Functional mobility during ADLs: Supervision/safety       Vision Vision Assessment?: No apparent visual deficits          Pertinent Vitals/Pain Pain Assessment: No/denies pain     Hand Dominance Right   Extremity/Trunk Assessment Upper Extremity Assessment Upper Extremity Assessment: Overall WFL for tasks assessed   Lower Extremity Assessment Lower Extremity Assessment: Defer to PT evaluation   Cervical / Trunk Assessment Cervical / Trunk Assessment: Normal   Communication Communication Communication: Expressive difficulties   Cognition Arousal/Alertness: Awake/alert Behavior During Therapy: WFL for tasks assessed/performed Overall Cognitive Status: Within Functional Limits for tasks assessed                                Home Living Family/patient expects to be discharged to:: Private residence Living Arrangements: Alone Available Help at Discharge: Friend(s);Available PRN/intermittently (Friend with her from 6:30am to 9:00 pm daily)               Bathroom Shower/Tub: Tub/shower unit;Walk-in shower   Bathroom Toilet: Standard     Home Equipment: None  Prior Functioning/Environment Level of Independence: Independent             OT Diagnosis: Generalized weakness   OT Problem List: Decreased cognition (expressive aphasia)    End of Session    Activity Tolerance: Patient tolerated treatment well Patient left: in bed;with call bell/phone within reach;with family/visitor present   Time: 1610-9604 OT Time Calculation (min): 26 min Charges:  OT General Charges $OT Visit: 1 Procedure OT Evaluation $OT Eval Low Complexity: 1  Procedure   Ezra Sites, OTR/L  [phone]  03/17/2016, 9:49 AM

## 2016-03-18 LAB — HEMOGLOBIN A1C
Hgb A1c MFr Bld: 5.5 % (ref 4.8–5.6)
Mean Plasma Glucose: 111 mg/dL

## 2016-09-14 IMAGING — US US CAROTID DUPLEX BILAT
1 series · 13 of 24 positions shown · non-contrast
Comparison: None.

CLINICAL DATA: Aphasia

EXAM:
BILATERAL CAROTID DUPLEX ULTRASOUND
TECHNIQUE: Gray scale imaging, color Doppler and duplex ultrasound were
performed of bilateral carotid and vertebral arteries in the neck.

[Series 1: us carotid duplex bilat · 0.06mm/px · 13 of 68 slices shown]
[im 1/68]
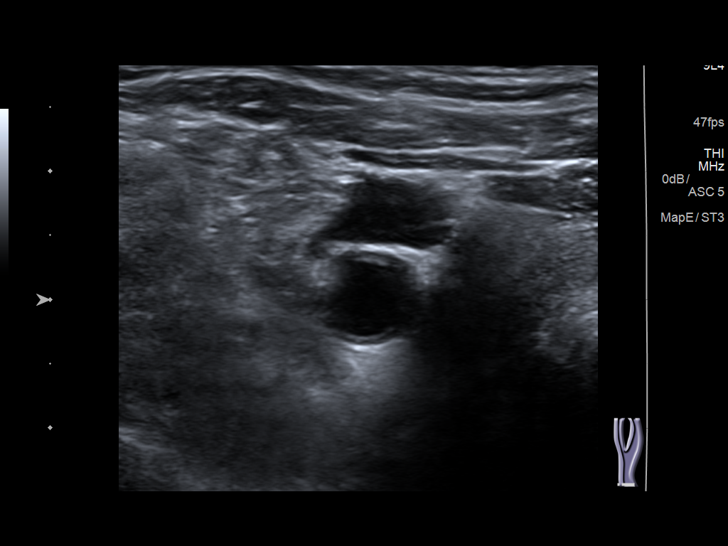
[im 6/68]
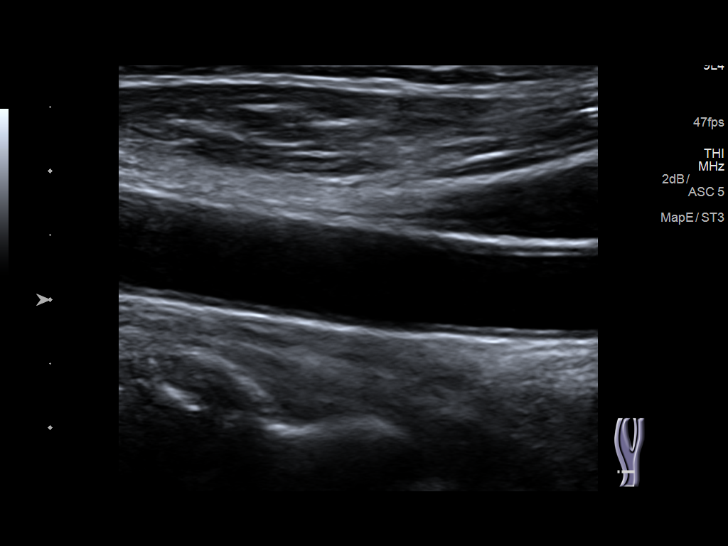
[im 12/68]
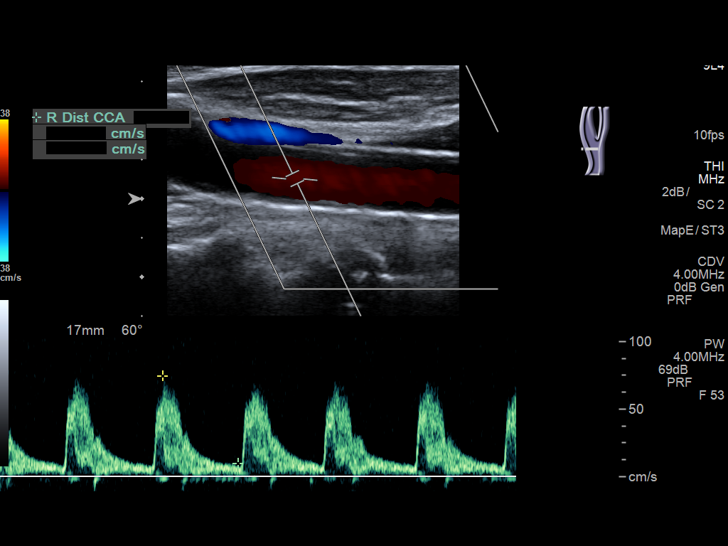
[im 18/68]
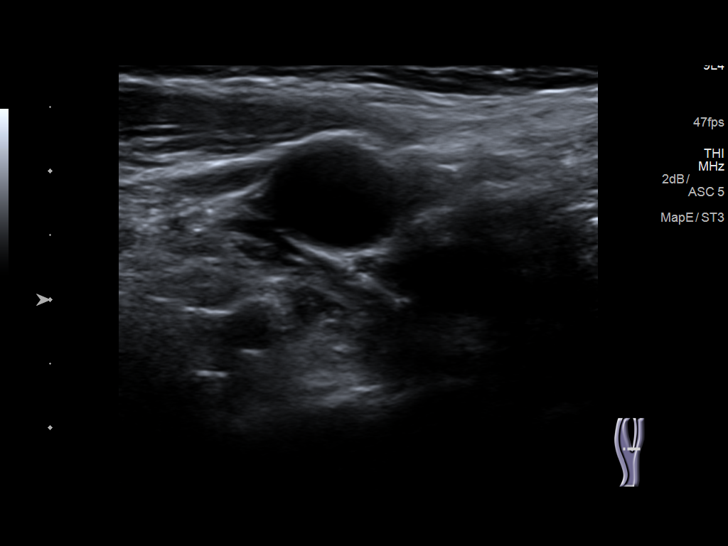
[im 24/68]
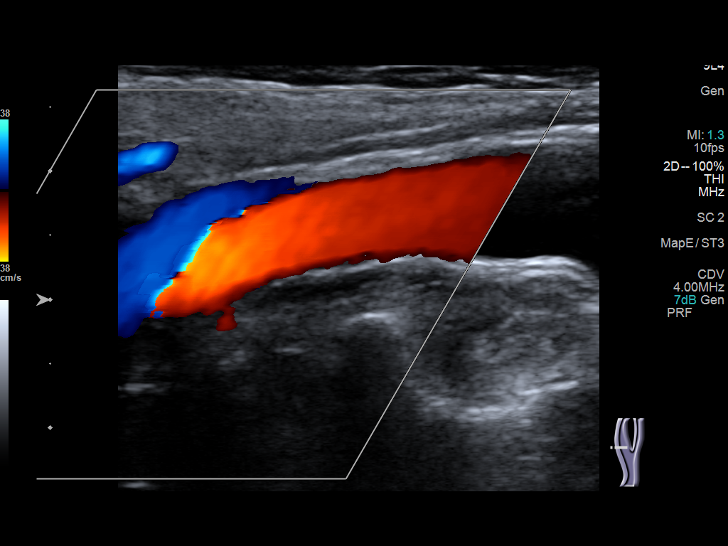
[im 30/68]
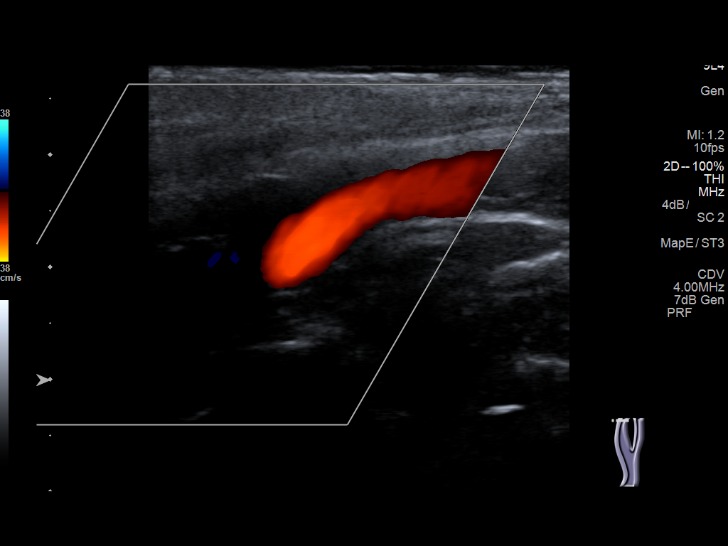
[im 35/68]
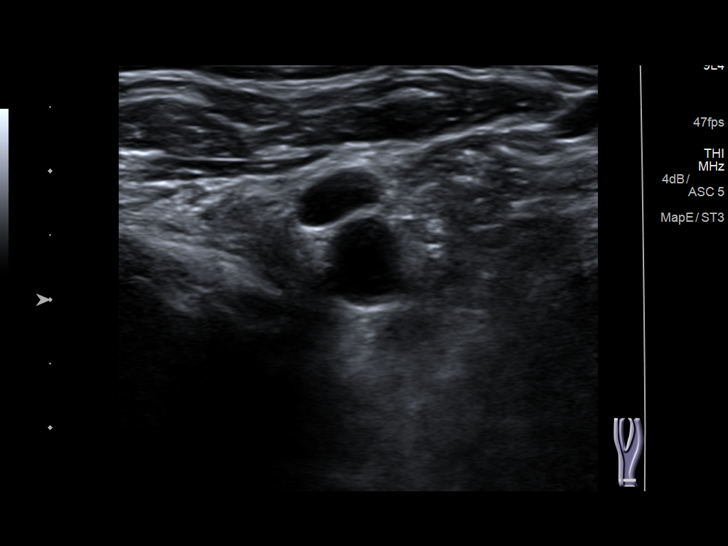
[im 38/68]
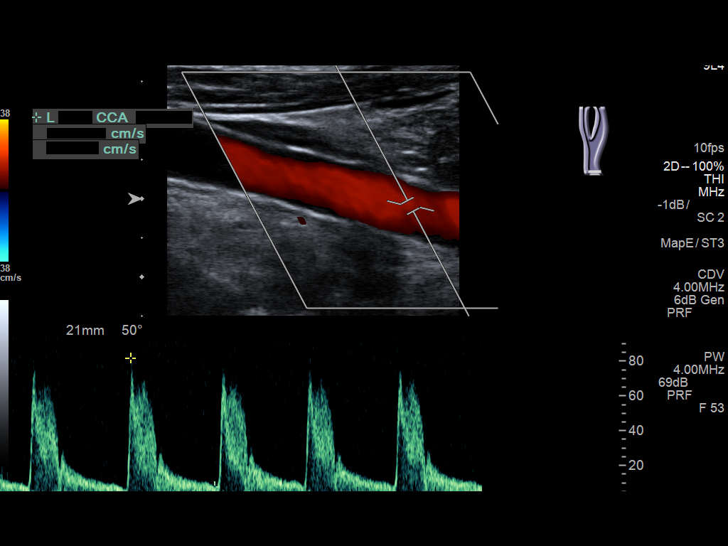
[im 44/68]
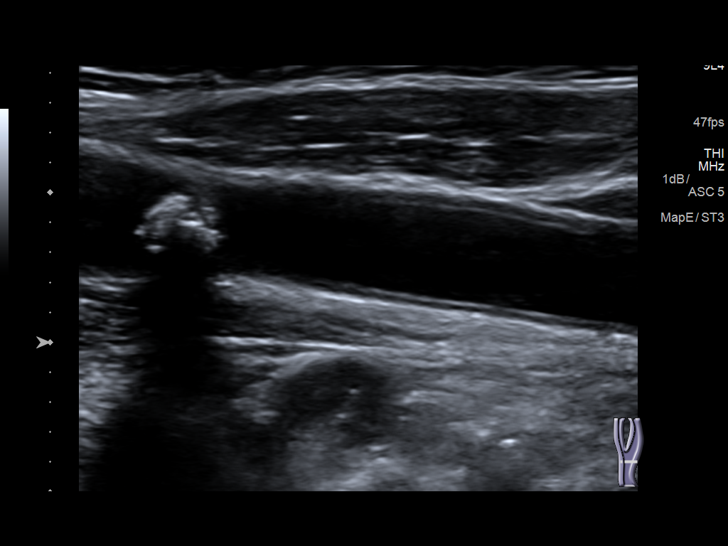
[im 50/68]
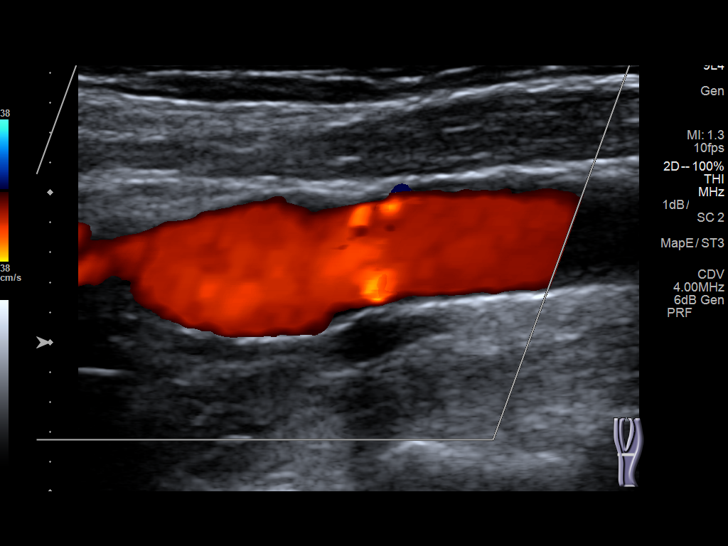
[im 56/68]
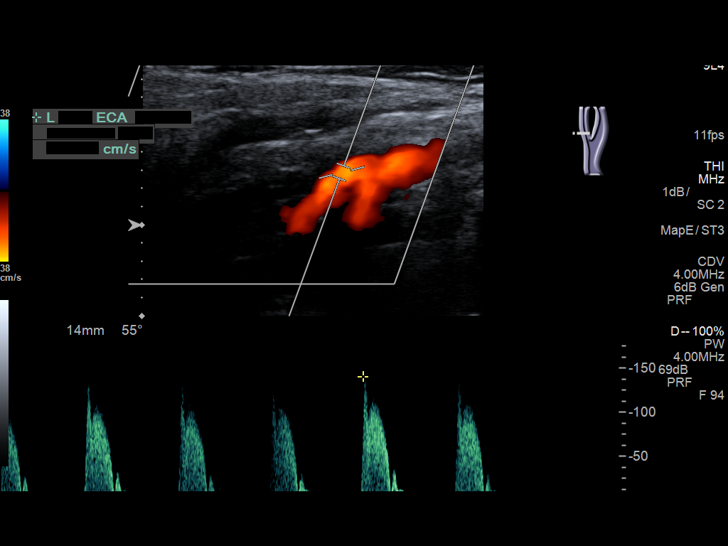
[im 62/68]
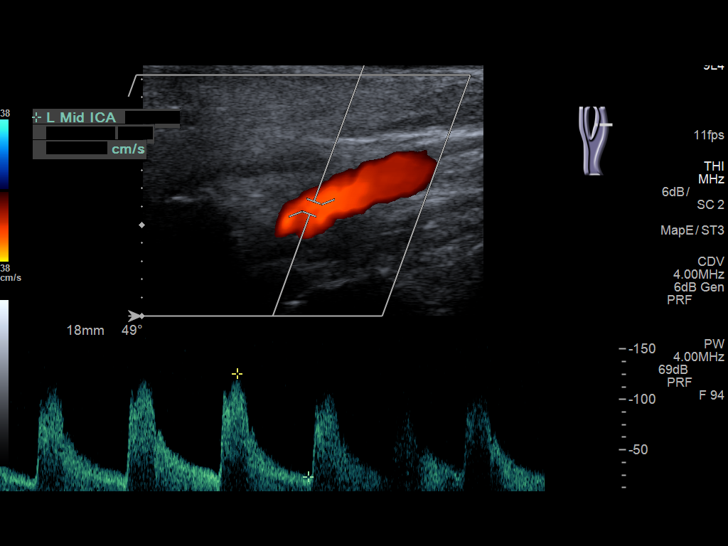
[im 68/68]
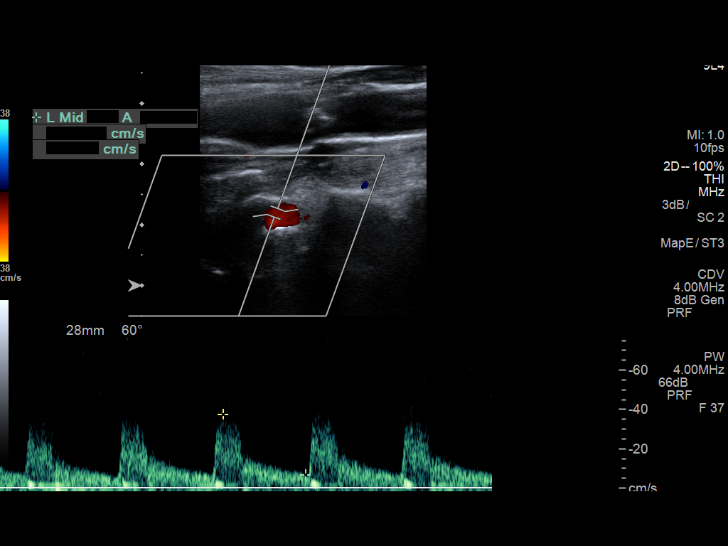

[13 of 24 positions shown; findings below may reference images not displayed]

FINDINGS: Criteria: Quantification of carotid stenosis is based on velocity
parameters that correlate the residual internal carotid diameter
with NASCET-based stenosis levels, using the diameter of the distal
internal carotid lumen as the denominator for stenosis measurement.

The following velocity measurements were obtained:

RIGHT

ICA:  78 cm/sec

CCA:  77 cm/sec

SYSTOLIC ICA/CCA RATIO:

DIASTOLIC ICA/CCA RATIO:

ECA:  61 cm/sec

LEFT

ICA:  126 cm/sec

CCA:  85 cm/sec

SYSTOLIC ICA/CCA RATIO:

DIASTOLIC ICA/CCA RATIO:

ECA:  140 cm/sec

RIGHT CAROTID ARTERY: Minimal plaque in the bulb. Low resistance
internal carotid Doppler pattern.

RIGHT VERTEBRAL ARTERY:  Antegrade.

LEFT CAROTID ARTERY: Prominent focal calcified plaque in the bulb.
Low resistance internal carotid Doppler pattern.

LEFT VERTEBRAL ARTERY:  Antegrade.
IMPRESSION: Less than 50% stenosis in the right internal carotid artery

50-69% stenosis in the left internal carotid artery secondary to
focal calcified plaque in the bulb.

## 2017-08-08 IMAGING — CR DG CHEST 1V
1 series · 1 of 1 positions shown · non-contrast
Comparison: None.

CLINICAL DATA: Acute embolic stroke.

EXAM:
CHEST 1 VIEW

[ap portable]
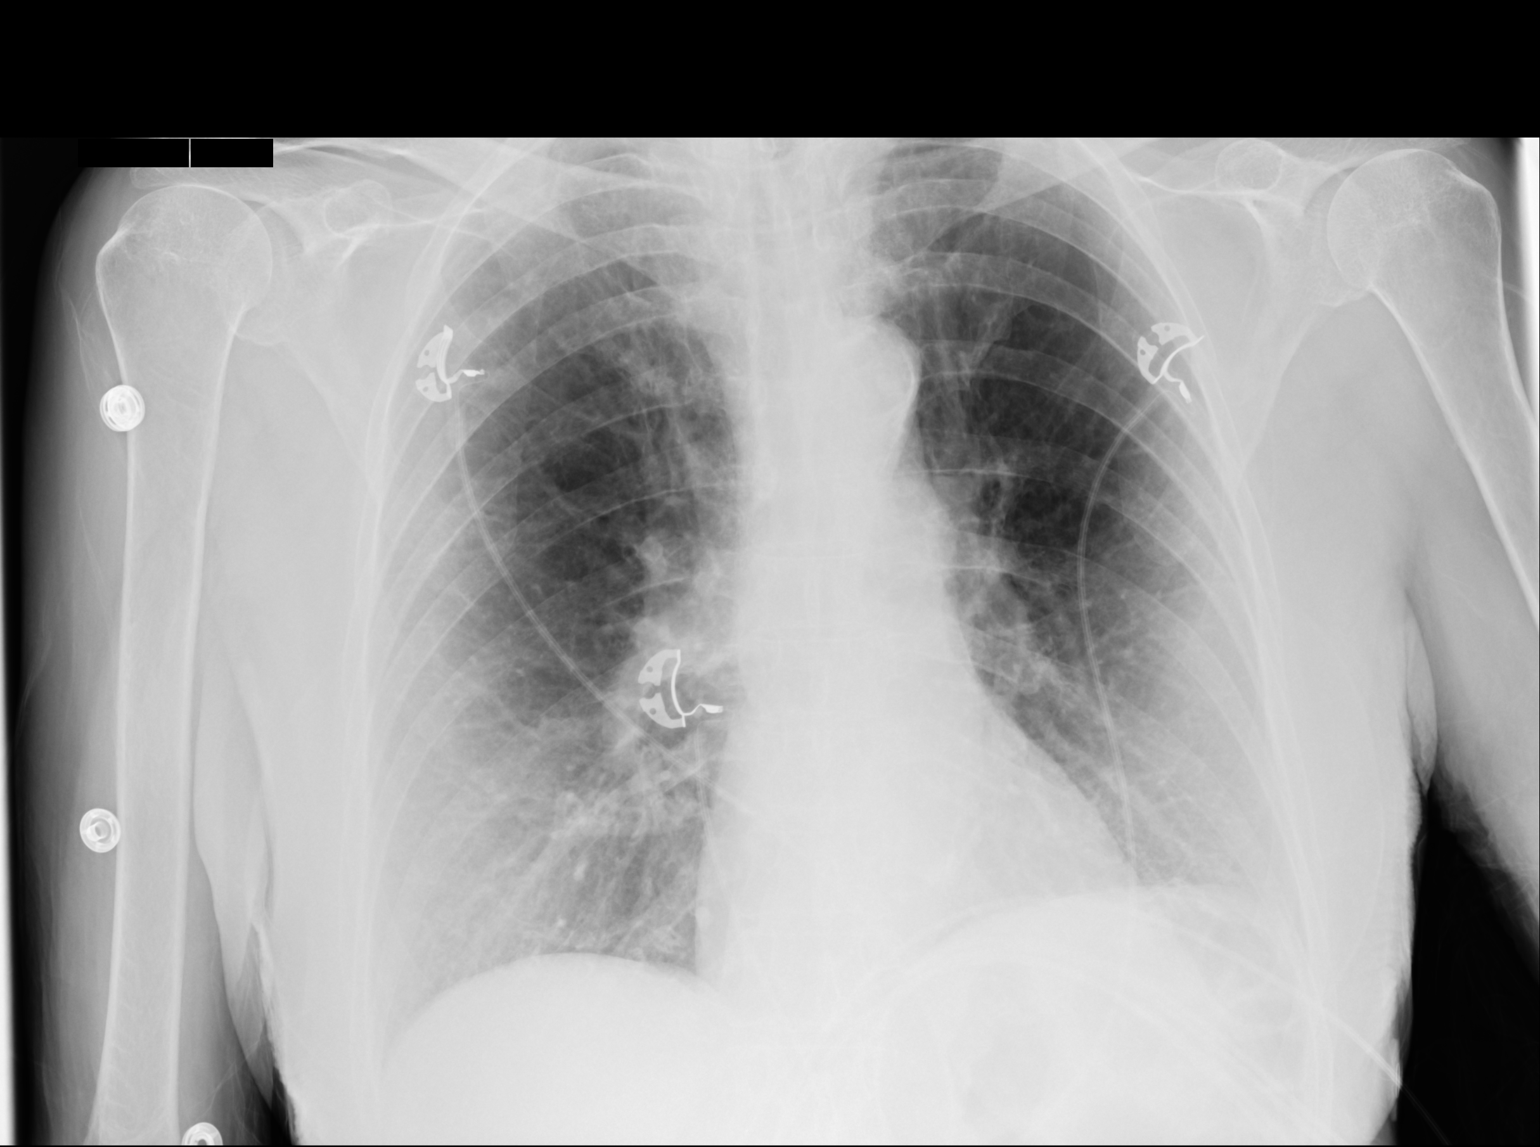

[1 of 1 positions shown; findings below may reference images not displayed]

FINDINGS: Cardiomediastinal silhouette is normal. Mediastinal contours appear
intact. Tortuosity and calcific atherosclerotic disease of the aorta
are noted.

There is no evidence of focal airspace consolidation, pleural
effusion or pneumothorax. There is mild coarsening of interstitial
markings.

Osseous structures are without acute abnormality. Soft tissues are
grossly normal.
IMPRESSION: Mild chronic interstitial lung disease.

Atherosclerotic disease of the aorta.

## 2020-04-28 DEATH — deceased
